# Patient Record
Sex: Female | Born: 1980 | Race: Black or African American | Marital: Married | Smoking: Former smoker
Health system: Southern US, Community
[De-identification: ages and names within clinical notes are randomized; demographics above are authoritative.]

## PROBLEM LIST (undated history)

## (undated) DIAGNOSIS — J45909 Unspecified asthma, uncomplicated: Secondary | ICD-10-CM

## (undated) DIAGNOSIS — I10 Essential (primary) hypertension: Secondary | ICD-10-CM

## (undated) DIAGNOSIS — E119 Type 2 diabetes mellitus without complications: Secondary | ICD-10-CM

## (undated) DIAGNOSIS — R87629 Unspecified abnormal cytological findings in specimens from vagina: Secondary | ICD-10-CM

## (undated) DIAGNOSIS — O24419 Gestational diabetes mellitus in pregnancy, unspecified control: Secondary | ICD-10-CM

## (undated) HISTORY — PX: BREAST CYST EXCISION: SHX579

## (undated) HISTORY — DX: Essential (primary) hypertension: I10

## (undated) HISTORY — PX: WISDOM TOOTH EXTRACTION: SHX21

## (undated) HISTORY — DX: Type 2 diabetes mellitus without complications: E11.9

## (undated) HISTORY — DX: Unspecified abnormal cytological findings in specimens from vagina: R87.629

## (undated) HISTORY — DX: Unspecified asthma, uncomplicated: J45.909

---
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
## 2004-10-22 DIAGNOSIS — O24419 Gestational diabetes mellitus in pregnancy, unspecified control: Secondary | ICD-10-CM

## 2006-03-13 DIAGNOSIS — O1490 Unspecified pre-eclampsia, unspecified trimester: Secondary | ICD-10-CM

## 2009-03-13 DIAGNOSIS — IMO0001 Reserved for inherently not codable concepts without codable children: Secondary | ICD-10-CM

## 2012-06-18 DIAGNOSIS — O24419 Gestational diabetes mellitus in pregnancy, unspecified control: Secondary | ICD-10-CM

## 2015-03-05 NOTE — L&D Delivery Note (Signed)
Delivery Note Called to room for precipitous delivery.  Baby was born as I walked in the door.  At 6:27 AM a viable and healthy female was delivered via Vaginal, Spontaneous Delivery (Presentation: ; Occiput Anterior).  APGAR:9/9 , ; weight  .   Placenta status: Intact, Spontaneous.  Cord: 3 vessels with the following complications: None.    Anesthesia: None  Episiotomy: None Lacerations: None Suture Repair: none required Est. Blood Loss (mL): 75  Mom to postpartum.  Baby to Couplet care / Skin to Skin.  [facility],MARIE 06/18/2015, 6:41 AM

## 2015-03-30 LAB — OB RESULTS CONSOLE GC/CHLAMYDIA
Chlamydia: NEGATIVE
Gonorrhea: NEGATIVE

## 2015-03-30 LAB — OB RESULTS CONSOLE ABO/RH: "RH Type ": POSITIVE

## 2015-03-30 LAB — OB RESULTS CONSOLE RUBELLA ANTIBODY, IGM: Rubella: IMMUNE

## 2015-03-30 LAB — CYTOLOGY - PAP: Pap: NEGATIVE

## 2015-03-30 LAB — OB RESULTS CONSOLE HEPATITIS B SURFACE ANTIGEN: Hepatitis B Surface Ag: NEGATIVE

## 2015-03-30 LAB — OB RESULTS CONSOLE PLATELET COUNT: Platelets: 132 10*3/uL

## 2015-03-30 LAB — OB RESULTS CONSOLE VARICELLA ZOSTER ANTIBODY, IGG: VARICELLA IGG: IMMUNE

## 2015-03-30 LAB — OB RESULTS CONSOLE ANTIBODY SCREEN: ANTIBODY SCREEN: NEGATIVE

## 2015-03-30 LAB — OB RESULTS CONSOLE RPR: RPR: NONREACTIVE

## 2015-03-30 LAB — OB RESULTS CONSOLE HGB/HCT, BLOOD
HCT: 34 %
Hemoglobin: 10.9 g/dL

## 2015-03-30 LAB — OB RESULTS CONSOLE HIV ANTIBODY (ROUTINE TESTING): HIV: NONREACTIVE

## 2015-04-03 ENCOUNTER — Encounter: Payer: Self-pay | Admitting: *Deleted

## 2015-04-03 DIAGNOSIS — O09529 Supervision of elderly multigravida, unspecified trimester: Secondary | ICD-10-CM

## 2015-04-03 DIAGNOSIS — O0990 Supervision of high risk pregnancy, unspecified, unspecified trimester: Secondary | ICD-10-CM

## 2015-04-03 DIAGNOSIS — O24419 Gestational diabetes mellitus in pregnancy, unspecified control: Secondary | ICD-10-CM

## 2015-04-03 DIAGNOSIS — Z8759 Personal history of other complications of pregnancy, childbirth and the puerperium: Secondary | ICD-10-CM | POA: Insufficient documentation

## 2015-04-03 DIAGNOSIS — O0930 Supervision of pregnancy with insufficient antenatal care, unspecified trimester: Secondary | ICD-10-CM

## 2015-04-07 ENCOUNTER — Encounter: Payer: Self-pay | Admitting: *Deleted

## 2015-04-07 DIAGNOSIS — O0930 Supervision of pregnancy with insufficient antenatal care, unspecified trimester: Secondary | ICD-10-CM | POA: Insufficient documentation

## 2015-04-10 ENCOUNTER — Encounter: Payer: Self-pay | Admitting: Family Medicine

## 2015-04-10 ENCOUNTER — Ambulatory Visit (INDEPENDENT_AMBULATORY_CARE_PROVIDER_SITE_OTHER): Payer: Medicaid Other | Admitting: Family Medicine

## 2015-04-10 ENCOUNTER — Encounter: Payer: Medicaid Other | Attending: Family Medicine | Admitting: *Deleted

## 2015-04-10 VITALS — BP 109/63 | HR 91 | Temp 98.0°F | Ht 66.0 in | Wt 226.1 lb

## 2015-04-10 DIAGNOSIS — J4520 Mild intermittent asthma, uncomplicated: Secondary | ICD-10-CM | POA: Diagnosis not present

## 2015-04-10 DIAGNOSIS — O09523 Supervision of elderly multigravida, third trimester: Secondary | ICD-10-CM

## 2015-04-10 DIAGNOSIS — Z641 Problems related to multiparity: Secondary | ICD-10-CM

## 2015-04-10 DIAGNOSIS — O09293 Supervision of pregnancy with other poor reproductive or obstetric history, third trimester: Secondary | ICD-10-CM

## 2015-04-10 DIAGNOSIS — O24410 Gestational diabetes mellitus in pregnancy, diet controlled: Secondary | ICD-10-CM

## 2015-04-10 DIAGNOSIS — O09529 Supervision of elderly multigravida, unspecified trimester: Secondary | ICD-10-CM

## 2015-04-10 DIAGNOSIS — T7840XA Allergy, unspecified, initial encounter: Secondary | ICD-10-CM | POA: Insufficient documentation

## 2015-04-10 DIAGNOSIS — J45909 Unspecified asthma, uncomplicated: Secondary | ICD-10-CM | POA: Insufficient documentation

## 2015-04-10 DIAGNOSIS — R8271 Bacteriuria: Secondary | ICD-10-CM | POA: Diagnosis not present

## 2015-04-10 DIAGNOSIS — Z029 Encounter for administrative examinations, unspecified: Secondary | ICD-10-CM | POA: Diagnosis present

## 2015-04-10 DIAGNOSIS — O09299 Supervision of pregnancy with other poor reproductive or obstetric history, unspecified trimester: Secondary | ICD-10-CM | POA: Insufficient documentation

## 2015-04-10 DIAGNOSIS — O0993 Supervision of high risk pregnancy, unspecified, third trimester: Secondary | ICD-10-CM

## 2015-04-10 LAB — POCT URINALYSIS DIP (DEVICE)
Bilirubin Urine: NEGATIVE
Glucose, UA: NEGATIVE mg/dL
Hgb urine dipstick: NEGATIVE
Ketones, ur: NEGATIVE mg/dL
Leukocytes, UA: NEGATIVE
Nitrite: NEGATIVE
Protein, ur: NEGATIVE mg/dL
Specific Gravity, Urine: 1.015 (ref 1.005–1.030)
Urobilinogen, UA: 0.2 mg/dL (ref 0.0–1.0)
pH: 7 (ref 5.0–8.0)

## 2015-04-10 MED ORDER — ACCU-CHEK NANO SMARTVIEW W/DEVICE KIT: 1.0000 [IU] | PACK | Status: DC

## 2015-04-10 MED ORDER — GLUCOSE BLOOD VI STRP: ORAL_STRIP | Status: DC

## 2015-04-10 MED ORDER — ACCU-CHEK FASTCLIX LANCETS MISC: 1.0000 [IU] | Freq: Four times a day (QID) | Status: DC

## 2015-04-10 NOTE — Patient Instructions (Signed)
Gestational Diabetes Mellitus Gestational diabetes mellitus, often simply referred to as gestational diabetes, is a type of diabetes that some women develop during pregnancy. In gestational diabetes, the pancreas does not make enough insulin (a hormone), the cells are less responsive to the insulin that is made (insulin resistance), or both.Normally, insulin moves sugars from food into the tissue cells. The tissue cells use the sugars for energy. The lack of insulin or the lack of normal response to insulin causes excess sugars to build up in the blood instead of going into the tissue cells. As a result, high blood sugar (hyperglycemia) develops. The effect of high sugar (glucose) levels can cause many problems.  RISK FACTORS You have an increased chance of developing gestational diabetes if you have a family history of diabetes and also have one or more of the following risk factors:  A body mass index over 30 (obesity).  A previous pregnancy with gestational diabetes.  An older age at the time of pregnancy. If blood glucose levels are kept in the normal range during pregnancy, women can have a healthy pregnancy. If your blood glucose levels are not well controlled, there may be risks to you, your unborn baby (fetus), your labor and delivery, or your newborn baby.  SYMPTOMS  If symptoms are experienced, they are much like symptoms you would normally expect during pregnancy. The symptoms of gestational diabetes include:   Increased thirst (polydipsia).  Increased urination (polyuria).  Increased urination during the night (nocturia).  Weight loss. This weight loss may be rapid.  Frequent, recurring infections.  Tiredness (fatigue).  Weakness.  Vision changes, such as blurred vision.  Fruity smell to your breath.  Abdominal pain. DIAGNOSIS Diabetes is diagnosed when blood glucose levels are increased. Your blood glucose level may be checked by one or more of the following blood  tests:  A fasting blood glucose test. You will not be allowed to eat for at least 8 hours before a blood sample is taken.  A random blood glucose test. Your blood glucose is checked at any time of the day regardless of when you ate.  An oral glucose tolerance test (OGTT). Your blood glucose is measured after you have not eaten (fasted) for 1-3 hours and then after you drink a glucose-containing beverage. Since the hormones that cause insulin resistance are highest at about 24-28 weeks of a pregnancy, an OGTT is usually performed during that time. If you have risk factors, you may be screened for undiagnosed type 2 diabetes at your first prenatal visit. TREATMENT  Gestational diabetes should be managed first with diet and exercise. Medicines may be added only if they are needed.  You will need to take diabetes medicine or insulin daily to keep blood glucose levels in the desired range.  You will need to match insulin dosing with exercise and healthy food choices. If you have gestational diabetes, your treatment goal is to maintain the following blood glucose levels:  Before meals (preprandial): at or below 95 mg/dL.  After meals (postprandial):  One hour after a meal: at or below 140 mg/dL.  Two hours after a meal: at or below 120 mg/dL. If you have pre-existing type 1 or type 2 diabetes, your treatment goal is to maintain the following blood glucose levels:  Before meals, at bedtime, and overnight: 60-99 mg/dL.  After meals: peak of 100-129 mg/dL. HOME CARE INSTRUCTIONS   Have your hemoglobin A1c level checked twice a year.  Perform daily blood glucose monitoring as directed by   your health care provider. It is common to perform frequent blood glucose monitoring.  Monitor urine ketones when you are ill and as directed by your health care provider.  Take your diabetes medicine and insulin as directed by your health care provider to maintain your blood glucose level in the desired  range.  Never run out of diabetes medicine or insulin. It is needed every day.  Adjust insulin based on your intake of carbohydrates. Carbohydrates can raise blood glucose levels but need to be included in your diet. Carbohydrates provide vitamins, minerals, and fiber which are an essential part of a healthy diet. Carbohydrates are found in fruits, vegetables, whole grains, dairy products, legumes, and foods containing added sugars.  Eat healthy foods. Alternate 3 meals with 3 snacks.  Maintain a healthy weight gain. The usual total expected weight gain varies according to your prepregnancy body mass index (BMI).  Carry a medical alert card or wear your medical alert jewelry.  Carry a 15-gram carbohydrate snack with you at all times to treat low blood glucose (hypoglycemia). Some examples of 15-gram carbohydrate snacks include:  Glucose tablets, 3 or 4.  Glucose gel, 15-gram tube.  Raisins, 2 tablespoons (24 g).  Jelly beans, 6.  Animal crackers, 8.  Fruit juice, regular soda, or low-fat milk, 4 ounces (120 mL).  Gummy treats, 9.  Recognize hypoglycemia. Hypoglycemia during pregnancy occurs with blood glucose levels of 60 mg/dL and below. The risk for hypoglycemia increases when fasting or skipping meals, during or after intense exercise, and during sleep. Hypoglycemia symptoms can include:  Tremors or shakes.  Decreased ability to concentrate.  Sweating.  Increased heart rate.  Headache.  Dry mouth.  Hunger.  Irritability.  Anxiety.  Restless sleep.  Altered speech or coordination.  Confusion.  Treat hypoglycemia promptly. If you are alert and able to safely swallow, follow the 15:15 rule:  Take 15-20 grams of rapid-acting glucose or carbohydrate. Rapid-acting options include glucose gel, glucose tablets, or 4 ounces (120 mL) of fruit juice, regular soda, or low-fat milk.  Check your blood glucose level 15 minutes after taking the glucose.  Take 15-20  grams more of glucose if the repeat blood glucose level is still 70 mg/dL or below.  Eat a meal or snack within 1 hour once blood glucose levels return to normal.  Be alert to polyuria (excess urination) and polydipsia (excess thirst) which are early signs of hyperglycemia. An early awareness of hyperglycemia allows for prompt treatment. Treat hyperglycemia as directed by your health care provider.  Engage in at least 30 minutes of physical activity a day or as directed by your health care provider. Ten minutes of physical activity timed 30 minutes after each meal is encouraged to control postprandial blood glucose levels.  Adjust your insulin dosing and food intake as needed if you start a new exercise or sport.  Follow your sick-day plan at any time you are unable to eat or drink as usual.  Avoid tobacco and alcohol use.  Keep all follow-up visits as directed by your health care provider.  Follow the advice of your health care provider regarding your prenatal and post-delivery (postpartum) appointments, meal planning, exercise, medicines, vitamins, blood tests, other medical tests, and physical activities.  Perform daily skin and foot care. Examine your skin and feet daily for cuts, bruises, redness, nail problems, bleeding, blisters, or sores.  Brush your teeth and gums at least twice a day and floss at least once a day. Follow up with your dentist   regularly.  Schedule an eye exam during the first trimester of your pregnancy or as directed by your health care provider.  Share your diabetes management plan with your workplace or school.  Stay up-to-date with immunizations.  Learn to manage stress.  Obtain ongoing diabetes education and support as needed.  Learn about and consider breastfeeding your baby.  You should have your blood sugar level checked 6-12 weeks after delivery. This is done with an oral glucose tolerance test (OGTT). SEEK MEDICAL CARE IF:   You are unable to  eat food or drink fluids for more than 6 hours.  You have nausea and vomiting for more than 6 hours.  You have a blood glucose level of 200 mg/dL and you have ketones in your urine.  There is a change in mental status.  You develop vision problems.  You have a persistent headache.  You have upper abdominal pain or discomfort.  You develop an additional serious illness.  You have diarrhea for more than 6 hours.  You have been sick or have had a fever for a couple of days and are not getting better. SEEK IMMEDIATE MEDICAL CARE IF:   You have difficulty breathing.  You no longer feel the baby moving.  You are bleeding or have discharge from your vagina.  You start having premature contractions or labor. MAKE SURE YOU:  Understand these instructions.  Will watch your condition.  Will get help right away if you are not doing well or get worse.   This information is not intended to replace advice given to you by your health care provider. Make sure you discuss any questions you have with your health care provider.   Document Released: 05/27/2000 Document Revised: 03/11/2014 Document Reviewed: 09/17/2011 Elsevier Interactive Patient Education 2016 [employer]  Breastfeeding Deciding to breastfeed is one of the best choices you can make for you and your baby. A change in hormones during pregnancy causes your breast tissue to grow and increases the number and size of your milk ducts. These hormones also allow proteins, sugars, and fats from your blood supply to make breast milk in your milk-producing glands. Hormones prevent breast milk from being released before your baby is born as well as prompt milk flow after birth. Once breastfeeding has begun, thoughts of your baby, as well as his or her sucking or crying, can stimulate the release of milk from your milk-producing glands.  BENEFITS OF BREASTFEEDING For Your Baby  Your first milk (colostrum) helps your baby's digestive  system function better.  There are antibodies in your milk that help your baby fight off infections.  Your baby has a lower incidence of asthma, allergies, and sudden infant death syndrome.  The nutrients in breast milk are better for your baby than infant formulas and are designed uniquely for your baby's needs.  Breast milk improves your baby's brain development.  Your baby is less likely to develop other conditions, such as childhood obesity, asthma, or type 2 diabetes mellitus. For You  Breastfeeding helps to create a very special bond between you and your baby.  Breastfeeding is convenient. Breast milk is always available at the correct temperature and costs nothing.  Breastfeeding helps to burn calories and helps you lose the weight gained during pregnancy.  Breastfeeding makes your uterus contract to its prepregnancy size faster and slows bleeding (lochia) after you give birth.   Breastfeeding helps to lower your risk of developing type 2 diabetes mellitus, osteoporosis, and breast or ovarian cancer   later in life. SIGNS THAT YOUR BABY IS HUNGRY Early Signs of Hunger  Increased alertness or activity.  Stretching.  Movement of the head from side to side.  Movement of the head and opening of the mouth when the corner of the mouth or cheek is stroked (rooting).  Increased sucking sounds, smacking lips, cooing, sighing, or squeaking.  Hand-to-mouth movements.  Increased sucking of fingers or hands. Late Signs of Hunger  Fussing.  Intermittent crying. Extreme Signs of Hunger Signs of extreme hunger will require calming and consoling before your baby will be able to breastfeed successfully. Do not wait for the following signs of extreme hunger to occur before you initiate breastfeeding:  Restlessness.  A loud, strong cry.  Screaming. BREASTFEEDING BASICS Breastfeeding Initiation  Find a comfortable place to sit or lie down, with your neck and back well  supported.  Place a pillow or rolled up blanket under your baby to bring him or her to the level of your breast (if you are seated). Nursing pillows are specially designed to help support your arms and your baby while you breastfeed.  Make sure that your baby's abdomen is facing your abdomen.  Gently massage your breast. With your fingertips, massage from your chest wall toward your nipple in a circular motion. This encourages milk flow. You may need to continue this action during the feeding if your milk flows slowly.  Support your breast with 4 fingers underneath and your thumb above your nipple. Make sure your fingers are well away from your nipple and your baby's mouth.  Stroke your baby's lips gently with your finger or nipple.  When your baby's mouth is open wide enough, quickly bring your baby to your breast, placing your entire nipple and as much of the colored area around your nipple (areola) as possible into your baby's mouth.  More areola should be visible above your baby's upper lip than below the lower lip.  Your baby's tongue should be between his or her lower gum and your breast.  Ensure that your baby's mouth is correctly positioned around your nipple (latched). Your baby's lips should create a seal on your breast and be turned out (everted).  It is common for your baby to suck about 2-3 minutes in order to start the flow of breast milk. Latching Teaching your baby how to latch on to your breast properly is very important. An improper latch can cause nipple pain and decreased milk supply for you and poor weight gain in your baby. Also, if your baby is not latched onto your nipple properly, he or she may swallow some air during feeding. This can make your baby fussy. Burping your baby when you switch breasts during the feeding can help to get rid of the air. However, teaching your baby to latch on properly is still the best way to prevent fussiness from swallowing air while  breastfeeding. Signs that your baby has successfully latched on to your nipple:  Silent tugging or silent sucking, without causing you pain.  Swallowing heard between every 3-4 sucks.  Muscle movement above and in front of his or her ears while sucking. Signs that your baby has not successfully latched on to nipple:  Sucking sounds or smacking sounds from your baby while breastfeeding.  Nipple pain. If you think your baby has not latched on correctly, slip your finger into the corner of your baby's mouth to break the suction and place it between your baby's gums. Attempt breastfeeding initiation again. Signs   of Successful Breastfeeding Signs from your baby:  A gradual decrease in the number of sucks or complete cessation of sucking.  Falling asleep.  Relaxation of his or her body.  Retention of a small amount of milk in his or her mouth.  Letting go of your breast by himself or herself. Signs from you:  Breasts that have increased in firmness, weight, and size 1-3 hours after feeding.  Breasts that are softer immediately after breastfeeding.  Increased milk volume, as well as a change in milk consistency and color by the fifth day of breastfeeding.  Nipples that are not sore, cracked, or bleeding. Signs That Your Baby is Getting Enough Milk  Wetting at least 3 diapers in a 24-hour period. The urine should be clear and pale yellow by age 5 days.  At least 3 stools in a 24-hour period by age 5 days. The stool should be soft and yellow.  At least 3 stools in a 24-hour period by age 7 days. The stool should be seedy and yellow.  No loss of weight greater than 10% of birth weight during the first 3 days of age.  Average weight gain of 4-7 ounces (113-198 g) per week after age 4 days.  Consistent daily weight gain by age 5 days, without weight loss after the age of 2 weeks. After a feeding, your baby may spit up a small amount. This is common. BREASTFEEDING FREQUENCY AND  DURATION Frequent feeding will help you make more milk and can prevent sore nipples and breast engorgement. Breastfeed when you feel the need to reduce the fullness of your breasts or when your baby shows signs of hunger. This is called "breastfeeding on demand." Avoid introducing a pacifier to your baby while you are working to establish breastfeeding (the first 4-6 weeks after your baby is born). After this time you may choose to use a pacifier. Research has shown that pacifier use during the first year of a baby's life decreases the risk of sudden infant death syndrome (SIDS). Allow your baby to feed on each breast as long as he or she wants. Breastfeed until your baby is finished feeding. When your baby unlatches or falls asleep while feeding from the first breast, offer the second breast. Because newborns are often sleepy in the first few weeks of life, you may need to awaken your baby to get him or her to feed. Breastfeeding times will vary from baby to baby. However, the following rules can serve as a guide to help you ensure that your baby is properly fed:  Newborns (babies 4 weeks of age or younger) may breastfeed every 1-3 hours.  Newborns should not go longer than 3 hours during the day or 5 hours during the night without breastfeeding.  You should breastfeed your baby a minimum of 8 times in a 24-hour period until you begin to introduce solid foods to your baby at around 6 months of age. BREAST MILK PUMPING Pumping and storing breast milk allows you to ensure that your baby is exclusively fed your breast milk, even at times when you are unable to breastfeed. This is especially important if you are going back to work while you are still breastfeeding or when you are not able to be present during feedings. Your lactation consultant can give you guidelines on how long it is safe to store breast milk. A breast pump is a machine that allows you to pump milk from your breast into a sterile bottle.  The pumped   breast milk can then be stored in a refrigerator or freezer. Some breast pumps are operated by hand, while others use electricity. Ask your lactation consultant which type will work best for you. Breast pumps can be purchased, but some hospitals and breastfeeding support groups lease breast pumps on a monthly basis. A lactation consultant can teach you how to hand express breast milk, if you prefer not to use a pump. CARING FOR YOUR BREASTS WHILE YOU BREASTFEED Nipples can become dry, cracked, and sore while breastfeeding. The following recommendations can help keep your breasts moisturized and healthy:  Avoid using soap on your nipples.  Wear a supportive bra. Although not required, special nursing bras and tank tops are designed to allow access to your breasts for breastfeeding without taking off your entire bra or top. Avoid wearing underwire-style bras or extremely tight bras.  Air dry your nipples for 3-4minutes after each feeding.  Use only cotton bra pads to absorb leaked breast milk. Leaking of breast milk between feedings is normal.  Use lanolin on your nipples after breastfeeding. Lanolin helps to maintain your skin's normal moisture barrier. If you use pure lanolin, you do not need to wash it off before feeding your baby again. Pure lanolin is not toxic to your baby. You may also hand express a few drops of breast milk and gently massage that milk into your nipples and allow the milk to air dry. In the first few weeks after giving birth, some women experience extremely full breasts (engorgement). Engorgement can make your breasts feel heavy, warm, and tender to the touch. Engorgement peaks within 3-5 days after you give birth. The following recommendations can help ease engorgement:  Completely empty your breasts while breastfeeding or pumping. You may want to start by applying warm, moist heat (in the shower or with warm water-soaked hand towels) just before feeding or pumping.  This increases circulation and helps the milk flow. If your baby does not completely empty your breasts while breastfeeding, pump any extra milk after he or she is finished.  Wear a snug bra (nursing or regular) or tank top for 1-2 days to signal your body to slightly decrease milk production.  Apply ice packs to your breasts, unless this is too uncomfortable for you.  Make sure that your baby is latched on and positioned properly while breastfeeding. If engorgement persists after 48 hours of following these recommendations, contact your health care provider or a lactation consultant. OVERALL HEALTH CARE RECOMMENDATIONS WHILE BREASTFEEDING  Eat healthy foods. Alternate between meals and snacks, eating 3 of each per day. Because what you eat affects your breast milk, some of the foods may make your baby more irritable than usual. Avoid eating these foods if you are sure that they are negatively affecting your baby.  Drink milk, fruit juice, and water to satisfy your thirst (about 10 glasses a day).  Rest often, relax, and continue to take your prenatal vitamins to prevent fatigue, stress, and anemia.  Continue breast self-awareness checks.  Avoid chewing and smoking tobacco. Chemicals from cigarettes that pass into breast milk and exposure to secondhand smoke may harm your baby.  Avoid alcohol and drug use, including marijuana. Some medicines that may be harmful to your baby can pass through breast milk. It is important to ask your health care provider before taking any medicine, including all over-the-counter and prescription medicine as well as vitamin and herbal supplements. It is possible to become pregnant while breastfeeding. If birth control is desired, ask   your health care provider about options that will be safe for your baby. SEEK MEDICAL CARE IF:  You feel like you want to stop breastfeeding or have become frustrated with breastfeeding.  You have painful breasts or  nipples.  Your nipples are cracked or bleeding.  Your breasts are red, tender, or warm.  You have a swollen area on either breast.  You have a fever or chills.  You have nausea or vomiting.  You have drainage other than breast milk from your nipples.  Your breasts do not become full before feedings by the fifth day after you give birth.  You feel sad and depressed.  Your baby is too sleepy to eat well.  Your baby is having trouble sleeping.   Your baby is wetting less than 3 diapers in a 24-hour period.  Your baby has less than 3 stools in a 24-hour period.  Your baby's skin or the white part of his or her eyes becomes yellow.   Your baby is not gaining weight by 5 days of age. SEEK IMMEDIATE MEDICAL CARE IF:  Your baby is overly tired (lethargic) and does not want to wake up and feed.  Your baby develops an unexplained fever.   This information is not intended to replace advice given to you by your health care provider. Make sure you discuss any questions you have with your health care provider.   Document Released: 02/18/2005 Document Revised: 11/09/2014 Document Reviewed: 08/12/2012 Elsevier Interactive Patient Education 2016 [employer]  

## 2015-04-10 NOTE — Progress Notes (Signed)
Pt refuses tdap and flu vaccine. States that she doesn't take any vaccinations.

## 2015-04-10 NOTE — Progress Notes (Signed)
  Patient was seen on 04/10/15 for Gestational Diabetes self-management . Patient has had GDM with multiple pregnancies. The following learning objectives were met by the patient :   States the definition of Gestational Diabetes  States when to check blood glucose levels  Demonstrates proper blood glucose monitoring techniques  States the effect of stress and exercise on blood glucose levels  States the importance of limiting caffeine and abstaining from alcohol and smoking  Plan:  Consider  increasing your activity level by walking daily as tolerated Begin checking BG before breakfast and 2 hours after first bit of breakfast, lunch and dinner after  as directed by MD  Take medication  as directed by MD  Blood glucose monitor given: Accu-chek AvivaConnect Lot # [id number] Exp: 04/03/16 Blood glucose reading: '167mg'$ /dl 1hpp chicken biscuit cheese  Patient instructed to monitor glucose levels: FBS: 60 - <90 2 hour: <120  Patient received the following handouts:  Nutrition Diabetes and Pregnancy  Patient will be seen for follow-up as needed.

## 2015-04-10 NOTE — Progress Notes (Signed)
Nutrition note: 1st visit consult & GDM diet education Pt has h/o obesity & has had GDM with previous pregnancies. Pt was recently diagnosed with GDM with this pregnancy. Pt has gained 26.1# @ [redacted]w[redacted]d, which is > expected. Pt reports eating 2-3 meals & 2-3 snacks/d (including a meal or snack ~midnight). Pt also reports drinking 1 Ensure most days. Pt is taking a PNV. Pt reports N&V occ but no heartburn. Pt reports walking for at least 30 mins 5x/wk. Pt received verbal & written education on GDM diet. Pt completed "Worksheet for Diabetes with Pregnancy." Discussed tips to decrease N&V. Discussed wt gain goals of 11-20# or 0.5#/wk. Pt agrees to follow GDM diet with 3 meals & 1-3 snacks/d with proper CHO/ protein combination. Pt does not have WIC but plans to apply. Pt plans to BF. F/u in 2-4 wks Stephanie Reveal, MS, RD, LDN, [facility]

## 2015-04-10 NOTE — Progress Notes (Signed)
Subjective:  Stephanie Wells is a 35 y.o. G7P5015 at [redacted]w[redacted]d being seen today for transferring prenatal care from [name] for GDM.  She is currently monitored for the following issues for this high-risk pregnancy and has Supervision of high-risk pregnancy; Gestational diabetes; History of pre-eclampsia; AMA (advanced maternal age) multigravida 35+; Late prenatal care; Airway hyperreactivity; Allergic state; Grand multipara; and History of macrosomia in infant in prior pregnancy, currently pregnant on her problem list.  Patient reports no complaints.  Contractions: Not present. Vag. Bleeding: None.  Movement: Present. Denies leaking of fluid.   The following portions of the patient's history were reviewed and updated as appropriate: allergies, current medications, past family history, past medical history, past social history, past surgical history and problem list. Problem list updated.  Objective:   Filed Vitals:   04/10/15 0955  BP: 109/63  Pulse: 91  Temp: 98 F (36.7 C)  Weight: 226 lb 1.6 oz (102.558 kg)    Fetal Status: Fetal Heart Rate (bpm): 156 Fundal Height: 31 cm Movement: Present     General:  Alert, oriented and cooperative. Patient is in no acute distress.  Skin: Skin is warm and dry. No rash noted.   Cardiovascular: Normal heart rate noted  Respiratory: Normal respiratory effort, no problems with respiration noted  Abdomen: Soft, gravid, appropriate for gestational age. Pain/Pressure: Absent     Pelvic: Vag. Bleeding: None     Cervical exam deferred        Extremities: Normal range of motion.  Edema: None  Mental Status: Normal mood and affect. Normal behavior. Normal judgment and thought content.   Urinalysis: Urine Protein: Negative Urine Glucose: Negative  Assessment and Plan:  Pregnancy: [id number] at [redacted]w[redacted]d  1. Supervision of high-risk pregnancy, third trimester Continue prenatal care.  2. Grand multipara At risk of PPH  3. AMA (advanced maternal age) multigravida  35+, unspecified trimester Declined genetics  4. Diet controlled gestational diabetes mellitus in third trimester Nutrition and Diabetes Managment - ACCU-CHEK FASTCLIX LANCETS MISC; 1 Units by Percutaneous route 4 (four) times daily.  Dispense: 100 each; Refill: 12 - glucose blood (ACCU-CHEK SMARTVIEW) test strip; New DX GDM O24.419 for testing 4 times daily. Please dispense Accu-Chek Aviva Plus test strips  Dispense: 100 each; Refill: 12  5. History of macrosomia in infant in prior pregnancy, currently pregnant, third trimester No issues with delivery  Preterm labor symptoms and general obstetric precautions including but not limited to vaginal bleeding, contractions, leaking of fluid and fetal movement were reviewed in detail with the patient. Please refer to After Visit Summary for other counseling recommendations.  Return in 2 weeks (on 04/24/2015).   [name], MD

## 2015-04-17 ENCOUNTER — Telehealth: Payer: Self-pay | Admitting: *Deleted

## 2015-04-17 NOTE — Telephone Encounter (Signed)
Patient stated returning a call about her blood sugars. Has appointment next Monday. Call her back.

## 2015-04-22 ENCOUNTER — Encounter: Payer: Self-pay | Admitting: *Deleted

## 2015-04-24 ENCOUNTER — Encounter: Payer: Medicaid Other | Admitting: Family Medicine

## 2015-04-24 ENCOUNTER — Ambulatory Visit (INDEPENDENT_AMBULATORY_CARE_PROVIDER_SITE_OTHER): Payer: Medicaid Other | Admitting: Family Medicine

## 2015-04-24 ENCOUNTER — Encounter: Payer: Self-pay | Admitting: Family Medicine

## 2015-04-24 VITALS — BP 113/68 | HR 88 | Temp 98.4°F | Wt 229.9 lb

## 2015-04-24 DIAGNOSIS — O0993 Supervision of high risk pregnancy, unspecified, third trimester: Secondary | ICD-10-CM

## 2015-04-24 DIAGNOSIS — O24410 Gestational diabetes mellitus in pregnancy, diet controlled: Secondary | ICD-10-CM | POA: Diagnosis not present

## 2015-04-24 LAB — POCT URINALYSIS DIP (DEVICE)
Bilirubin Urine: NEGATIVE
Glucose, UA: NEGATIVE mg/dL
KETONES UR: NEGATIVE mg/dL
Leukocytes, UA: NEGATIVE
Nitrite: NEGATIVE
PH: 6.5 (ref 5.0–8.0)
PROTEIN: NEGATIVE mg/dL
SPECIFIC GRAVITY, URINE: 1.01 (ref 1.005–1.030)
Urobilinogen, UA: 0.2 mg/dL (ref 0.0–1.0)

## 2015-04-24 NOTE — Progress Notes (Signed)
Breastfeeding tip of the week reviewed. 

## 2015-04-24 NOTE — Telephone Encounter (Signed)
Pt showed for appt on 04/24/15 concerns addressed.

## 2015-04-24 NOTE — Patient Instructions (Signed)
Gestational Diabetes Mellitus Gestational diabetes mellitus, often simply referred to as gestational diabetes, is a type of diabetes that some women develop during pregnancy. In gestational diabetes, the pancreas does not make enough insulin (a hormone), the cells are less responsive to the insulin that is made (insulin resistance), or both.Normally, insulin moves sugars from food into the tissue cells. The tissue cells use the sugars for energy. The lack of insulin or the lack of normal response to insulin causes excess sugars to build up in the blood instead of going into the tissue cells. As a result, high blood sugar (hyperglycemia) develops. The effect of high sugar (glucose) levels can cause many problems.  RISK FACTORS You have an increased chance of developing gestational diabetes if you have a family history of diabetes and also have one or more of the following risk factors:  A body mass index over 30 (obesity).  A previous pregnancy with gestational diabetes.  An older age at the time of pregnancy. If blood glucose levels are kept in the normal range during pregnancy, women can have a healthy pregnancy. If your blood glucose levels are not well controlled, there may be risks to you, your unborn baby (fetus), your labor and delivery, or your newborn baby.  SYMPTOMS  If symptoms are experienced, they are much like symptoms you would normally expect during pregnancy. The symptoms of gestational diabetes include:   Increased thirst (polydipsia).  Increased urination (polyuria).  Increased urination during the night (nocturia).  Weight loss. This weight loss may be rapid.  Frequent, recurring infections.  Tiredness (fatigue).  Weakness.  Vision changes, such as blurred vision.  Fruity smell to your breath.  Abdominal pain. DIAGNOSIS Diabetes is diagnosed when blood glucose levels are increased. Your blood glucose level may be checked by one or more of the following blood  tests:  A fasting blood glucose test. You will not be allowed to eat for at least 8 hours before a blood sample is taken.  A random blood glucose test. Your blood glucose is checked at any time of the day regardless of when you ate.  An oral glucose tolerance test (OGTT). Your blood glucose is measured after you have not eaten (fasted) for 1-3 hours and then after you drink a glucose-containing beverage. Since the hormones that cause insulin resistance are highest at about 24-28 weeks of a pregnancy, an OGTT is usually performed during that time. If you have risk factors, you may be screened for undiagnosed type 2 diabetes at your first prenatal visit. TREATMENT  Gestational diabetes should be managed first with diet and exercise. Medicines may be added only if they are needed.  You will need to take diabetes medicine or insulin daily to keep blood glucose levels in the desired range.  You will need to match insulin dosing with exercise and healthy food choices. If you have gestational diabetes, your treatment goal is to maintain the following blood glucose levels:  Before meals (preprandial): at or below 95 mg/dL.  After meals (postprandial):  One hour after a meal: at or below 140 mg/dL.  Two hours after a meal: at or below 120 mg/dL. If you have pre-existing type 1 or type 2 diabetes, your treatment goal is to maintain the following blood glucose levels:  Before meals, at bedtime, and overnight: 60-99 mg/dL.  After meals: peak of 100-129 mg/dL. HOME CARE INSTRUCTIONS   Have your hemoglobin A1c level checked twice a year.  Perform daily blood glucose monitoring as directed by   your health care provider. It is common to perform frequent blood glucose monitoring.  Monitor urine ketones when you are ill and as directed by your health care provider.  Take your diabetes medicine and insulin as directed by your health care provider to maintain your blood glucose level in the desired  range.  Never run out of diabetes medicine or insulin. It is needed every day.  Adjust insulin based on your intake of carbohydrates. Carbohydrates can raise blood glucose levels but need to be included in your diet. Carbohydrates provide vitamins, minerals, and fiber which are an essential part of a healthy diet. Carbohydrates are found in fruits, vegetables, whole grains, dairy products, legumes, and foods containing added sugars.  Eat healthy foods. Alternate 3 meals with 3 snacks.  Maintain a healthy weight gain. The usual total expected weight gain varies according to your prepregnancy body mass index (BMI).  Carry a medical alert card or wear your medical alert jewelry.  Carry a 15-gram carbohydrate snack with you at all times to treat low blood glucose (hypoglycemia). Some examples of 15-gram carbohydrate snacks include:  Glucose tablets, 3 or 4.  Glucose gel, 15-gram tube.  Raisins, 2 tablespoons (24 g).  Jelly beans, 6.  Animal crackers, 8.  Fruit juice, regular soda, or low-fat milk, 4 ounces (120 mL).  Gummy treats, 9.  Recognize hypoglycemia. Hypoglycemia during pregnancy occurs with blood glucose levels of 60 mg/dL and below. The risk for hypoglycemia increases when fasting or skipping meals, during or after intense exercise, and during sleep. Hypoglycemia symptoms can include:  Tremors or shakes.  Decreased ability to concentrate.  Sweating.  Increased heart rate.  Headache.  Dry mouth.  Hunger.  Irritability.  Anxiety.  Restless sleep.  Altered speech or coordination.  Confusion.  Treat hypoglycemia promptly. If you are alert and able to safely swallow, follow the 15:15 rule:  Take 15-20 grams of rapid-acting glucose or carbohydrate. Rapid-acting options include glucose gel, glucose tablets, or 4 ounces (120 mL) of fruit juice, regular soda, or low-fat milk.  Check your blood glucose level 15 minutes after taking the glucose.  Take 15-20  grams more of glucose if the repeat blood glucose level is still 70 mg/dL or below.  Eat a meal or snack within 1 hour once blood glucose levels return to normal.  Be alert to polyuria (excess urination) and polydipsia (excess thirst) which are early signs of hyperglycemia. An early awareness of hyperglycemia allows for prompt treatment. Treat hyperglycemia as directed by your health care provider.  Engage in at least 30 minutes of physical activity a day or as directed by your health care provider. Ten minutes of physical activity timed 30 minutes after each meal is encouraged to control postprandial blood glucose levels.  Adjust your insulin dosing and food intake as needed if you start a new exercise or sport.  Follow your sick-day plan at any time you are unable to eat or drink as usual.  Avoid tobacco and alcohol use.  Keep all follow-up visits as directed by your health care provider.  Follow the advice of your health care provider regarding your prenatal and post-delivery (postpartum) appointments, meal planning, exercise, medicines, vitamins, blood tests, other medical tests, and physical activities.  Perform daily skin and foot care. Examine your skin and feet daily for cuts, bruises, redness, nail problems, bleeding, blisters, or sores.  Brush your teeth and gums at least twice a day and floss at least once a day. Follow up with your dentist   regularly.  Schedule an eye exam during the first trimester of your pregnancy or as directed by your health care provider.  Share your diabetes management plan with your workplace or school.  Stay up-to-date with immunizations.  Learn to manage stress.  Obtain ongoing diabetes education and support as needed.  Learn about and consider breastfeeding your baby.  You should have your blood sugar level checked 6-12 weeks after delivery. This is done with an oral glucose tolerance test (OGTT). SEEK MEDICAL CARE IF:   You are unable to  eat food or drink fluids for more than 6 hours.  You have nausea and vomiting for more than 6 hours.  You have a blood glucose level of 200 mg/dL and you have ketones in your urine.  There is a change in mental status.  You develop vision problems.  You have a persistent headache.  You have upper abdominal pain or discomfort.  You develop an additional serious illness.  You have diarrhea for more than 6 hours.  You have been sick or have had a fever for a couple of days and are not getting better. SEEK IMMEDIATE MEDICAL CARE IF:   You have difficulty breathing.  You no longer feel the baby moving.  You are bleeding or have discharge from your vagina.  You start having premature contractions or labor. MAKE SURE YOU:  Understand these instructions.  Will watch your condition.  Will get help right away if you are not doing well or get worse.   This information is not intended to replace advice given to you by your health care provider. Make sure you discuss any questions you have with your health care provider.   Document Released: 05/27/2000 Document Revised: 03/11/2014 Document Reviewed: 09/17/2011 Elsevier Interactive Patient Education 2016 [employer]  Breastfeeding Deciding to breastfeed is one of the best choices you can make for you and your baby. A change in hormones during pregnancy causes your breast tissue to grow and increases the number and size of your milk ducts. These hormones also allow proteins, sugars, and fats from your blood supply to make breast milk in your milk-producing glands. Hormones prevent breast milk from being released before your baby is born as well as prompt milk flow after birth. Once breastfeeding has begun, thoughts of your baby, as well as his or her sucking or crying, can stimulate the release of milk from your milk-producing glands.  BENEFITS OF BREASTFEEDING For Your Baby  Your first milk (colostrum) helps your baby's digestive  system function better.  There are antibodies in your milk that help your baby fight off infections.  Your baby has a lower incidence of asthma, allergies, and sudden infant death syndrome.  The nutrients in breast milk are better for your baby than infant formulas and are designed uniquely for your baby's needs.  Breast milk improves your baby's brain development.  Your baby is less likely to develop other conditions, such as childhood obesity, asthma, or type 2 diabetes mellitus. For You  Breastfeeding helps to create a very special bond between you and your baby.  Breastfeeding is convenient. Breast milk is always available at the correct temperature and costs nothing.  Breastfeeding helps to burn calories and helps you lose the weight gained during pregnancy.  Breastfeeding makes your uterus contract to its prepregnancy size faster and slows bleeding (lochia) after you give birth.   Breastfeeding helps to lower your risk of developing type 2 diabetes mellitus, osteoporosis, and breast or ovarian cancer   later in life. SIGNS THAT YOUR BABY IS HUNGRY Early Signs of Hunger  Increased alertness or activity.  Stretching.  Movement of the head from side to side.  Movement of the head and opening of the mouth when the corner of the mouth or cheek is stroked (rooting).  Increased sucking sounds, smacking lips, cooing, sighing, or squeaking.  Hand-to-mouth movements.  Increased sucking of fingers or hands. Late Signs of Hunger  Fussing.  Intermittent crying. Extreme Signs of Hunger Signs of extreme hunger will require calming and consoling before your baby will be able to breastfeed successfully. Do not wait for the following signs of extreme hunger to occur before you initiate breastfeeding:  Restlessness.  A loud, strong cry.  Screaming. BREASTFEEDING BASICS Breastfeeding Initiation  Find a comfortable place to sit or lie down, with your neck and back well  supported.  Place a pillow or rolled up blanket under your baby to bring him or her to the level of your breast (if you are seated). Nursing pillows are specially designed to help support your arms and your baby while you breastfeed.  Make sure that your baby's abdomen is facing your abdomen.  Gently massage your breast. With your fingertips, massage from your chest wall toward your nipple in a circular motion. This encourages milk flow. You may need to continue this action during the feeding if your milk flows slowly.  Support your breast with 4 fingers underneath and your thumb above your nipple. Make sure your fingers are well away from your nipple and your baby's mouth.  Stroke your baby's lips gently with your finger or nipple.  When your baby's mouth is open wide enough, quickly bring your baby to your breast, placing your entire nipple and as much of the colored area around your nipple (areola) as possible into your baby's mouth.  More areola should be visible above your baby's upper lip than below the lower lip.  Your baby's tongue should be between his or her lower gum and your breast.  Ensure that your baby's mouth is correctly positioned around your nipple (latched). Your baby's lips should create a seal on your breast and be turned out (everted).  It is common for your baby to suck about 2-3 minutes in order to start the flow of breast milk. Latching Teaching your baby how to latch on to your breast properly is very important. An improper latch can cause nipple pain and decreased milk supply for you and poor weight gain in your baby. Also, if your baby is not latched onto your nipple properly, he or she may swallow some air during feeding. This can make your baby fussy. Burping your baby when you switch breasts during the feeding can help to get rid of the air. However, teaching your baby to latch on properly is still the best way to prevent fussiness from swallowing air while  breastfeeding. Signs that your baby has successfully latched on to your nipple:  Silent tugging or silent sucking, without causing you pain.  Swallowing heard between every 3-4 sucks.  Muscle movement above and in front of his or her ears while sucking. Signs that your baby has not successfully latched on to nipple:  Sucking sounds or smacking sounds from your baby while breastfeeding.  Nipple pain. If you think your baby has not latched on correctly, slip your finger into the corner of your baby's mouth to break the suction and place it between your baby's gums. Attempt breastfeeding initiation again. Signs   of Successful Breastfeeding Signs from your baby:  A gradual decrease in the number of sucks or complete cessation of sucking.  Falling asleep.  Relaxation of his or her body.  Retention of a small amount of milk in his or her mouth.  Letting go of your breast by himself or herself. Signs from you:  Breasts that have increased in firmness, weight, and size 1-3 hours after feeding.  Breasts that are softer immediately after breastfeeding.  Increased milk volume, as well as a change in milk consistency and color by the fifth day of breastfeeding.  Nipples that are not sore, cracked, or bleeding. Signs That Your Baby is Getting Enough Milk  Wetting at least 3 diapers in a 24-hour period. The urine should be clear and pale yellow by age 5 days.  At least 3 stools in a 24-hour period by age 5 days. The stool should be soft and yellow.  At least 3 stools in a 24-hour period by age 7 days. The stool should be seedy and yellow.  No loss of weight greater than 10% of birth weight during the first 3 days of age.  Average weight gain of 4-7 ounces (113-198 g) per week after age 4 days.  Consistent daily weight gain by age 5 days, without weight loss after the age of 2 weeks. After a feeding, your baby may spit up a small amount. This is common. BREASTFEEDING FREQUENCY AND  DURATION Frequent feeding will help you make more milk and can prevent sore nipples and breast engorgement. Breastfeed when you feel the need to reduce the fullness of your breasts or when your baby shows signs of hunger. This is called "breastfeeding on demand." Avoid introducing a pacifier to your baby while you are working to establish breastfeeding (the first 4-6 weeks after your baby is born). After this time you may choose to use a pacifier. Research has shown that pacifier use during the first year of a baby's life decreases the risk of sudden infant death syndrome (SIDS). Allow your baby to feed on each breast as long as he or she wants. Breastfeed until your baby is finished feeding. When your baby unlatches or falls asleep while feeding from the first breast, offer the second breast. Because newborns are often sleepy in the first few weeks of life, you may need to awaken your baby to get him or her to feed. Breastfeeding times will vary from baby to baby. However, the following rules can serve as a guide to help you ensure that your baby is properly fed:  Newborns (babies 4 weeks of age or younger) may breastfeed every 1-3 hours.  Newborns should not go longer than 3 hours during the day or 5 hours during the night without breastfeeding.  You should breastfeed your baby a minimum of 8 times in a 24-hour period until you begin to introduce solid foods to your baby at around 6 months of age. BREAST MILK PUMPING Pumping and storing breast milk allows you to ensure that your baby is exclusively fed your breast milk, even at times when you are unable to breastfeed. This is especially important if you are going back to work while you are still breastfeeding or when you are not able to be present during feedings. Your lactation consultant can give you guidelines on how long it is safe to store breast milk. A breast pump is a machine that allows you to pump milk from your breast into a sterile bottle.  The pumped   breast milk can then be stored in a refrigerator or freezer. Some breast pumps are operated by hand, while others use electricity. Ask your lactation consultant which type will work best for you. Breast pumps can be purchased, but some hospitals and breastfeeding support groups lease breast pumps on a monthly basis. A lactation consultant can teach you how to hand express breast milk, if you prefer not to use a pump. CARING FOR YOUR BREASTS WHILE YOU BREASTFEED Nipples can become dry, cracked, and sore while breastfeeding. The following recommendations can help keep your breasts moisturized and healthy:  Avoid using soap on your nipples.  Wear a supportive bra. Although not required, special nursing bras and tank tops are designed to allow access to your breasts for breastfeeding without taking off your entire bra or top. Avoid wearing underwire-style bras or extremely tight bras.  Air dry your nipples for 3-4minutes after each feeding.  Use only cotton bra pads to absorb leaked breast milk. Leaking of breast milk between feedings is normal.  Use lanolin on your nipples after breastfeeding. Lanolin helps to maintain your skin's normal moisture barrier. If you use pure lanolin, you do not need to wash it off before feeding your baby again. Pure lanolin is not toxic to your baby. You may also hand express a few drops of breast milk and gently massage that milk into your nipples and allow the milk to air dry. In the first few weeks after giving birth, some women experience extremely full breasts (engorgement). Engorgement can make your breasts feel heavy, warm, and tender to the touch. Engorgement peaks within 3-5 days after you give birth. The following recommendations can help ease engorgement:  Completely empty your breasts while breastfeeding or pumping. You may want to start by applying warm, moist heat (in the shower or with warm water-soaked hand towels) just before feeding or pumping.  This increases circulation and helps the milk flow. If your baby does not completely empty your breasts while breastfeeding, pump any extra milk after he or she is finished.  Wear a snug bra (nursing or regular) or tank top for 1-2 days to signal your body to slightly decrease milk production.  Apply ice packs to your breasts, unless this is too uncomfortable for you.  Make sure that your baby is latched on and positioned properly while breastfeeding. If engorgement persists after 48 hours of following these recommendations, contact your health care provider or a lactation consultant. OVERALL HEALTH CARE RECOMMENDATIONS WHILE BREASTFEEDING  Eat healthy foods. Alternate between meals and snacks, eating 3 of each per day. Because what you eat affects your breast milk, some of the foods may make your baby more irritable than usual. Avoid eating these foods if you are sure that they are negatively affecting your baby.  Drink milk, fruit juice, and water to satisfy your thirst (about 10 glasses a day).  Rest often, relax, and continue to take your prenatal vitamins to prevent fatigue, stress, and anemia.  Continue breast self-awareness checks.  Avoid chewing and smoking tobacco. Chemicals from cigarettes that pass into breast milk and exposure to secondhand smoke may harm your baby.  Avoid alcohol and drug use, including marijuana. Some medicines that may be harmful to your baby can pass through breast milk. It is important to ask your health care provider before taking any medicine, including all over-the-counter and prescription medicine as well as vitamin and herbal supplements. It is possible to become pregnant while breastfeeding. If birth control is desired, ask   your health care provider about options that will be safe for your baby. SEEK MEDICAL CARE IF:  You feel like you want to stop breastfeeding or have become frustrated with breastfeeding.  You have painful breasts or  nipples.  Your nipples are cracked or bleeding.  Your breasts are red, tender, or warm.  You have a swollen area on either breast.  You have a fever or chills.  You have nausea or vomiting.  You have drainage other than breast milk from your nipples.  Your breasts do not become full before feedings by the fifth day after you give birth.  You feel sad and depressed.  Your baby is too sleepy to eat well.  Your baby is having trouble sleeping.   Your baby is wetting less than 3 diapers in a 24-hour period.  Your baby has less than 3 stools in a 24-hour period.  Your baby's skin or the white part of his or her eyes becomes yellow.   Your baby is not gaining weight by 5 days of age. SEEK IMMEDIATE MEDICAL CARE IF:  Your baby is overly tired (lethargic) and does not want to wake up and feed.  Your baby develops an unexplained fever.   This information is not intended to replace advice given to you by your health care provider. Make sure you discuss any questions you have with your health care provider.   Document Released: 02/18/2005 Document Revised: 11/09/2014 Document Reviewed: 08/12/2012 Elsevier Interactive Patient Education 2016 [employer]  

## 2015-04-24 NOTE — Progress Notes (Signed)
Subjective:  Stephanie Wells is a 35 y.o. G7P5015 at [redacted]w[redacted]d being seen today for ongoing prenatal care.  She is currently monitored for the following issues for this high-risk pregnancy and has Supervision of high-risk pregnancy; Gestational diabetes; History of pre-eclampsia; AMA (advanced maternal age) multigravida 35+; Late prenatal care; Airway hyperreactivity; Allergic state; Grand multipara; and History of macrosomia in infant in prior pregnancy, currently pregnant on her problem list.  Patient reports no complaints.  Contractions: Not present. Vag. Bleeding: None.  Movement: Present. Denies leaking of fluid.   The following portions of the patient's history were reviewed and updated as appropriate: allergies, current medications, past family history, past medical history, past social history, past surgical history and problem list. Problem list updated.  Objective:   Filed Vitals:   04/24/15 1027  BP: 113/68  Pulse: 88  Temp: 98.4 F (36.9 C)  Weight: 229 lb 14.4 oz (104.282 kg)    Fetal Status: Fetal Heart Rate (bpm): 145   Movement: Present     General:  Alert, oriented and cooperative. Patient is in no acute distress.  Skin: Skin is warm and dry. No rash noted.   Cardiovascular: Normal heart rate noted  Respiratory: Normal respiratory effort, no problems with respiration noted  Abdomen: Soft, gravid, appropriate for gestational age. Pain/Pressure: Absent     Pelvic: Vag. Bleeding: None     Cervical exam deferred        Extremities: Normal range of motion.  Edema: None  Mental Status: Normal mood and affect. Normal behavior. Normal judgment and thought content.   Urinalysis: Urine Protein: Negative Urine Glucose: Negative FBS 99-115 (not true fastings, eating at 3 am to prevent nausea) 2 hour pp 72-151(8 of 40 are out of range) Assessment and Plan:  Pregnancy: [id number] at [redacted]w[redacted]d  1. Supervision of high-risk pregnancy, third trimester Continue prenatal care.  2. Diet  controlled gestational diabetes mellitus in third trimester Continue diet - [place] MFM OB COMP + 14 WK; Future  Preterm labor symptoms and general obstetric precautions including but not limited to vaginal bleeding, contractions, leaking of fluid and fetal movement were reviewed in detail with the patient. Please refer to After Visit Summary for other counseling recommendations.  Return in 2 weeks (on 05/08/2015).   [name], MD

## 2015-04-24 NOTE — Progress Notes (Signed)
U/S scheduled for 05/02/2015 @ 10:15 AM

## 2015-05-02 ENCOUNTER — Encounter (HOSPITAL_COMMUNITY): Payer: Self-pay

## 2015-05-02 ENCOUNTER — Other Ambulatory Visit: Payer: Self-pay | Admitting: Family Medicine

## 2015-05-02 ENCOUNTER — Ambulatory Visit (HOSPITAL_COMMUNITY)
Admission: RE | Admit: 2015-05-02 | Discharge: 2015-05-02 | Disposition: A | Discharge status: Home / Self Care | Payer: Medicaid Other | Source: Ambulatory Visit | Attending: Family Medicine | Admitting: Family Medicine

## 2015-05-02 VITALS — BP 132/65 | HR 88 | Wt 233.6 lb

## 2015-05-02 DIAGNOSIS — O0993 Supervision of high risk pregnancy, unspecified, third trimester: Secondary | ICD-10-CM

## 2015-05-02 DIAGNOSIS — O09523 Supervision of elderly multigravida, third trimester: Secondary | ICD-10-CM

## 2015-05-02 DIAGNOSIS — Z3A32 32 weeks gestation of pregnancy: Secondary | ICD-10-CM | POA: Diagnosis not present

## 2015-05-02 DIAGNOSIS — O24410 Gestational diabetes mellitus in pregnancy, diet controlled: Secondary | ICD-10-CM

## 2015-05-02 DIAGNOSIS — O09293 Supervision of pregnancy with other poor reproductive or obstetric history, third trimester: Secondary | ICD-10-CM | POA: Insufficient documentation

## 2015-05-02 DIAGNOSIS — Z8759 Personal history of other complications of pregnancy, childbirth and the puerperium: Secondary | ICD-10-CM

## 2015-05-02 DIAGNOSIS — O0933 Supervision of pregnancy with insufficient antenatal care, third trimester: Secondary | ICD-10-CM | POA: Diagnosis not present

## 2015-05-02 DIAGNOSIS — Z641 Problems related to multiparity: Secondary | ICD-10-CM

## 2015-05-02 DIAGNOSIS — O0930 Supervision of pregnancy with insufficient antenatal care, unspecified trimester: Secondary | ICD-10-CM

## 2015-05-02 DIAGNOSIS — O09529 Supervision of elderly multigravida, unspecified trimester: Secondary | ICD-10-CM

## 2015-05-08 ENCOUNTER — Encounter (HOSPITAL_COMMUNITY): Payer: Self-pay

## 2015-05-08 ENCOUNTER — Ambulatory Visit (INDEPENDENT_AMBULATORY_CARE_PROVIDER_SITE_OTHER): Payer: Medicaid Other | Admitting: Obstetrics and Gynecology

## 2015-05-08 ENCOUNTER — Encounter: Payer: Self-pay | Admitting: *Deleted

## 2015-05-08 ENCOUNTER — Other Ambulatory Visit (HOSPITAL_COMMUNITY): Payer: Self-pay | Admitting: Maternal and Fetal Medicine

## 2015-05-08 ENCOUNTER — Ambulatory Visit (HOSPITAL_COMMUNITY)
Admission: RE | Admit: 2015-05-08 | Discharge: 2015-05-08 | Disposition: A | Discharge status: Home / Self Care | Payer: Medicaid Other | Source: Ambulatory Visit | Attending: Family Medicine | Admitting: Family Medicine

## 2015-05-08 ENCOUNTER — Encounter: Payer: Self-pay | Admitting: Obstetrics and Gynecology

## 2015-05-08 VITALS — BP 110/60 | HR 81 | Temp 97.6°F | Wt 237.0 lb

## 2015-05-08 DIAGNOSIS — O09523 Supervision of elderly multigravida, third trimester: Secondary | ICD-10-CM

## 2015-05-08 DIAGNOSIS — O24410 Gestational diabetes mellitus in pregnancy, diet controlled: Secondary | ICD-10-CM

## 2015-05-08 DIAGNOSIS — O09293 Supervision of pregnancy with other poor reproductive or obstetric history, third trimester: Secondary | ICD-10-CM | POA: Diagnosis not present

## 2015-05-08 DIAGNOSIS — Z3A33 33 weeks gestation of pregnancy: Secondary | ICD-10-CM

## 2015-05-08 DIAGNOSIS — O0990 Supervision of high risk pregnancy, unspecified, unspecified trimester: Secondary | ICD-10-CM

## 2015-05-08 DIAGNOSIS — O0933 Supervision of pregnancy with insufficient antenatal care, third trimester: Secondary | ICD-10-CM

## 2015-05-08 LAB — POCT URINALYSIS DIP (DEVICE)
BILIRUBIN URINE: NEGATIVE
GLUCOSE, UA: NEGATIVE mg/dL
Ketones, ur: NEGATIVE mg/dL
NITRITE: NEGATIVE
Protein, ur: NEGATIVE mg/dL
Specific Gravity, Urine: 1.02 (ref 1.005–1.030)
UROBILINOGEN UA: 0.2 mg/dL (ref 0.0–1.0)
pH: 5.5 (ref 5.0–8.0)

## 2015-05-08 LAB — GLUCOSE, CAPILLARY: GLUCOSE-CAPILLARY: 96 mg/dL (ref 65–99)

## 2015-05-08 NOTE — Progress Notes (Signed)
Reviewed tip of week with patient Pt unsure of tdap vaccine, will consider

## 2015-05-08 NOTE — Patient Instructions (Signed)

## 2015-05-08 NOTE — Progress Notes (Signed)
Subjective:  Stephanie DinningJessica Wells is a 35 y.o. G7P5015 at 4863w2d being seen today for ongoing prenatal care.  She is currently monitored for the following issues for this high-risk pregnancy and has Supervision of high-risk pregnancy; Gestational diabetes; History of pre-eclampsia; AMA (advanced maternal age) multigravida 35+; Late prenatal care; Airway hyperreactivity; Allergic state; Grand multipara; and History of macrosomia in infant in prior pregnancy, currently pregnant on her problem list.  Patient reports no complaints.  Contractions: Not present. Vag. Bleeding: None.  Movement: Present. Denies leaking of fluid. Forgot log book and states fastings all <100, today 98. PP lunch <120 and PP dinner 120-130 MFM US done this am (report pending)> per pt polyhydramnios and F/U scan scheduled in 1 week  The following portions of the patient's history were reviewed and updated as appropriate: allergies, current medications, past family history, past medical history, past social history, past surgical history and problem list. Problem list updated.  Objective:   Filed Vitals:   05/08/15 0836  BP: 110/60  Pulse: 81  Temp: 97.6 F (36.4 C)  Weight: 237 lb (107.502 kg)   CBG about 2hr PPB now 98.  Fetal Status: Fetal Heart Rate (bpm): 147   Movement: Present     General:  Alert, oriented and cooperative. Patient is in no acute distress.  Skin: Skin is warm and dry. No rash noted.   Cardiovascular: Normal heart rate noted  Respiratory: Normal respiratory effort, no problems with respiration noted  Abdomen: Soft, gravid, appropriate for gestational age. Pain/Pressure: Present     Pelvic: Vag. Bleeding: None     Cervical exam deferred        Extremities: Normal range of motion.  Edema: None  Mental Status: Normal mood and affect. Normal behavior. Normal judgment and thought content.   Urinalysis: Urine Protein: Negative Urine Glucose: Negative  Assessment and Plan:  Pregnancy: [id number] at  3763w2d  1. Diet controlled gestational diabetes mellitus in third trimester 35 yo, obese, hx LGA. Control questionable. Discussed with Dr. Jolayne Pantheronstant. Will review CBGs in 1 wk and consider oral agent. See DM counselor today re: diet counseling - CBG monitoring, ED  Preterm labor symptoms and general obstetric precautions including but not limited to vaginal bleeding, contractions, leaking of fluid and fetal movement were reviewed in detail with the patient. Please refer to After Visit Summary for other counseling recommendations.  Return in about 1 week (around 05/15/2015).   Danae Orleanseirdre C Joycie Aerts, CNM

## 2015-05-08 NOTE — Progress Notes (Signed)
Nutrition Note: GDM follow-up Pt with GDM during pregnancy. Pt has gained 37 # @ 523w2d which is > expected. Discussed weight gain goals of 11-20# during pregnancy.  Pt reports no concerns following GDM diet during pregnancy. Pt reports blood sugar range of < 100 mg/dL during fasting and [id number] md/dL2 hours post-prandial. Pt does not have WIC and does not plan to apply during pregnancy. Follow up as needed. Carloyn Mannerebekah Sharin Altidor MS, RD, LDN

## 2015-05-12 ENCOUNTER — Other Ambulatory Visit: Payer: Self-pay | Admitting: Obstetrics and Gynecology

## 2015-05-12 DIAGNOSIS — O24410 Gestational diabetes mellitus in pregnancy, diet controlled: Secondary | ICD-10-CM

## 2015-05-12 DIAGNOSIS — O09523 Supervision of elderly multigravida, third trimester: Secondary | ICD-10-CM

## 2015-05-12 DIAGNOSIS — Z3A33 33 weeks gestation of pregnancy: Secondary | ICD-10-CM

## 2015-05-12 DIAGNOSIS — O09293 Supervision of pregnancy with other poor reproductive or obstetric history, third trimester: Secondary | ICD-10-CM

## 2015-05-12 DIAGNOSIS — O0933 Supervision of pregnancy with insufficient antenatal care, third trimester: Secondary | ICD-10-CM

## 2015-05-15 ENCOUNTER — Encounter (HOSPITAL_COMMUNITY): Payer: Self-pay

## 2015-05-15 ENCOUNTER — Encounter: Payer: Self-pay | Admitting: Obstetrics and Gynecology

## 2015-05-15 ENCOUNTER — Other Ambulatory Visit: Payer: Self-pay | Admitting: Obstetrics and Gynecology

## 2015-05-15 ENCOUNTER — Ambulatory Visit (INDEPENDENT_AMBULATORY_CARE_PROVIDER_SITE_OTHER): Payer: Medicaid Other | Admitting: Obstetrics and Gynecology

## 2015-05-15 ENCOUNTER — Other Ambulatory Visit (HOSPITAL_COMMUNITY): Payer: Self-pay | Admitting: Maternal and Fetal Medicine

## 2015-05-15 ENCOUNTER — Ambulatory Visit (HOSPITAL_COMMUNITY)
Admission: RE | Admit: 2015-05-15 | Discharge: 2015-05-15 | Disposition: A | Discharge status: Home / Self Care | Payer: Medicaid Other | Source: Ambulatory Visit | Attending: Family Medicine | Admitting: Family Medicine

## 2015-05-15 VITALS — BP 98/55 | HR 86 | Wt 239.2 lb

## 2015-05-15 DIAGNOSIS — O09523 Supervision of elderly multigravida, third trimester: Secondary | ICD-10-CM

## 2015-05-15 DIAGNOSIS — Z8632 Personal history of gestational diabetes: Secondary | ICD-10-CM

## 2015-05-15 DIAGNOSIS — Z3A34 34 weeks gestation of pregnancy: Secondary | ICD-10-CM | POA: Insufficient documentation

## 2015-05-15 DIAGNOSIS — Z641 Problems related to multiparity: Secondary | ICD-10-CM | POA: Diagnosis not present

## 2015-05-15 DIAGNOSIS — O0933 Supervision of pregnancy with insufficient antenatal care, third trimester: Secondary | ICD-10-CM

## 2015-05-15 DIAGNOSIS — O24410 Gestational diabetes mellitus in pregnancy, diet controlled: Secondary | ICD-10-CM | POA: Diagnosis not present

## 2015-05-15 DIAGNOSIS — Z8759 Personal history of other complications of pregnancy, childbirth and the puerperium: Secondary | ICD-10-CM

## 2015-05-15 DIAGNOSIS — O09293 Supervision of pregnancy with other poor reproductive or obstetric history, third trimester: Secondary | ICD-10-CM | POA: Diagnosis present

## 2015-05-15 DIAGNOSIS — O403XX0 Polyhydramnios, third trimester, not applicable or unspecified: Secondary | ICD-10-CM | POA: Insufficient documentation

## 2015-05-15 DIAGNOSIS — O0993 Supervision of high risk pregnancy, unspecified, third trimester: Secondary | ICD-10-CM

## 2015-05-15 DIAGNOSIS — Z3A33 33 weeks gestation of pregnancy: Secondary | ICD-10-CM

## 2015-05-15 HISTORY — DX: Gestational diabetes mellitus in pregnancy, unspecified control: O24.419

## 2015-05-15 LAB — POCT URINALYSIS DIP (DEVICE)
Bilirubin Urine: NEGATIVE
Glucose, UA: NEGATIVE mg/dL
KETONES UR: NEGATIVE mg/dL
Leukocytes, UA: NEGATIVE
NITRITE: NEGATIVE
PH: 5.5 (ref 5.0–8.0)
PROTEIN: NEGATIVE mg/dL
Specific Gravity, Urine: <=1.005 (ref 1.005–1.030)
Urobilinogen, UA: 0.2 mg/dL (ref 0.0–1.0)

## 2015-05-15 NOTE — Progress Notes (Signed)
Subjective:  Stephanie Wells is a 35 y.o. [id number] at 7126w2d being seen today for ongoing prenatal care.  She is currently monitored for the following issues for this high-risk pregnancy and has Supervision of high-risk pregnancy; Gestational diabetes; History of pre-eclampsia; AMA (advanced maternal age) multigravida 35+; Late prenatal care; Airway hyperreactivity; Allergic state; Grand multipara; and History of macrosomia in infant in prior pregnancy, currently pregnant on her problem list.  Patient reports no complaints.  Contractions: Not present. Vag. Bleeding: None.  Movement: Present. Denies leaking of fluid.   The following portions of the patient's history were reviewed and updated as appropriate: allergies, current medications, past family history, past medical history, past social history, past surgical history and problem list. Problem list updated.  Objective:   Filed Vitals:   05/15/15 1122  BP: 98/55  Pulse: 86  Weight: 239 lb 3.2 oz (108.5 kg)    Fetal Status: Fetal Heart Rate (bpm): 150 Fundal Height: 43 cm Movement: Present     General:  Alert, oriented and cooperative. Patient is in no acute distress.  Skin: Skin is warm and dry. No rash noted.   Cardiovascular: Normal heart rate noted  Respiratory: Normal respiratory effort, no problems with respiration noted  Abdomen: Soft, gravid, appropriate for gestational age. Pain/Pressure: Absent     Pelvic: Vag. Bleeding: None     Cervical exam deferred        Extremities: Normal range of motion.  Edema: None  Mental Status: Normal mood and affect. Normal behavior. Normal judgment and thought content.   Urinalysis:      Assessment and Plan:  Pregnancy: [id number] at 5326w2d  1. History of macrosomia in infant in prior pregnancy, currently pregnant, third trimester   2. Grand multipara   3. Diet-controlled gestational diabetes mellitus, unspecified trimester Patient did not bring meter and admits that she did not check CBG as  she was not feeling well  4. AMA (advanced maternal age) multigravida 35+, third trimester   5. Supervision of high-risk pregnancy, third trimester Patient with polyhydramnios. Will start twice weekly fetal testing next week. 8/8 BPP this morning  6. History of pre-eclampsia   7. Late prenatal care, third trimester   Preterm labor symptoms and general obstetric precautions including but not limited to vaginal bleeding, contractions, leaking of fluid and fetal movement were reviewed in detail with the patient. Please refer to After Visit Summary for other counseling recommendations.  Return in about 1 week (around 05/22/2015) for ob f/u with [facility].   Catalina AntiguaPeggy Genella Bas, MD

## 2015-05-15 NOTE — Progress Notes (Signed)
Pt didn't check glucose this weekend. She was sick with stomach virus.

## 2015-05-22 ENCOUNTER — Ambulatory Visit (INDEPENDENT_AMBULATORY_CARE_PROVIDER_SITE_OTHER): Payer: Medicaid Other | Admitting: Family Medicine

## 2015-05-22 ENCOUNTER — Other Ambulatory Visit (HOSPITAL_COMMUNITY)
Admission: RE | Admit: 2015-05-22 | Discharge: 2015-05-22 | Disposition: A | Discharge status: Home / Self Care | Payer: Medicaid Other | Source: Ambulatory Visit | Attending: Family Medicine | Admitting: Family Medicine

## 2015-05-22 VITALS — BP 122/65 | HR 73 | Temp 97.9°F | Wt 240.0 lb

## 2015-05-22 DIAGNOSIS — O403XX0 Polyhydramnios, third trimester, not applicable or unspecified: Secondary | ICD-10-CM | POA: Diagnosis not present

## 2015-05-22 DIAGNOSIS — Z113 Encounter for screening for infections with a predominantly sexual mode of transmission: Secondary | ICD-10-CM | POA: Insufficient documentation

## 2015-05-22 DIAGNOSIS — O09293 Supervision of pregnancy with other poor reproductive or obstetric history, third trimester: Secondary | ICD-10-CM | POA: Diagnosis not present

## 2015-05-22 DIAGNOSIS — O0933 Supervision of pregnancy with insufficient antenatal care, third trimester: Secondary | ICD-10-CM

## 2015-05-22 DIAGNOSIS — Z641 Problems related to multiparity: Secondary | ICD-10-CM

## 2015-05-22 DIAGNOSIS — O24419 Gestational diabetes mellitus in pregnancy, unspecified control: Secondary | ICD-10-CM | POA: Diagnosis present

## 2015-05-22 DIAGNOSIS — O0993 Supervision of high risk pregnancy, unspecified, third trimester: Secondary | ICD-10-CM

## 2015-05-22 DIAGNOSIS — Z8759 Personal history of other complications of pregnancy, childbirth and the puerperium: Secondary | ICD-10-CM

## 2015-05-22 DIAGNOSIS — O403XX1 Polyhydramnios, third trimester, fetus 1: Secondary | ICD-10-CM

## 2015-05-22 DIAGNOSIS — O09523 Supervision of elderly multigravida, third trimester: Secondary | ICD-10-CM

## 2015-05-22 LAB — GLUCOSE, CAPILLARY: GLUCOSE-CAPILLARY: 97 mg/dL (ref 65–99)

## 2015-05-22 LAB — POCT URINALYSIS DIP (DEVICE)
BILIRUBIN URINE: NEGATIVE
GLUCOSE, UA: NEGATIVE mg/dL
Ketones, ur: NEGATIVE mg/dL
Leukocytes, UA: NEGATIVE
NITRITE: NEGATIVE
PH: 6 (ref 5.0–8.0)
Protein, ur: NEGATIVE mg/dL
Specific Gravity, Urine: 1.01 (ref 1.005–1.030)
Urobilinogen, UA: 0.2 mg/dL (ref 0.0–1.0)

## 2015-05-22 LAB — OB RESULTS CONSOLE GBS: GBS: NEGATIVE

## 2015-05-22 NOTE — Patient Instructions (Signed)
Gestational Diabetes Mellitus  Gestational diabetes mellitus, often simply referred to as gestational diabetes, is a type of diabetes that some women develop during pregnancy. In gestational diabetes, the pancreas does not make enough insulin (a hormone), the cells are less responsive to the insulin that is made (insulin resistance), or both. Normally, insulin moves sugars from food into the tissue cells. The tissue cells use the sugars for energy. The lack of insulin or the lack of normal response to insulin causes excess sugars to build up in the blood instead of going into the tissue cells. As a result, high blood sugar (hyperglycemia) develops. The effect of high sugar (glucose) levels can cause many problems.   RISK FACTORS  You have an increased chance of developing gestational diabetes if you have a family history of diabetes and also have one or more of the following risk factors:  · A body mass index over 30 (obesity).  · A previous pregnancy with gestational diabetes.  · An older age at the time of pregnancy.  If blood glucose levels are kept in the normal range during pregnancy, women can have a healthy pregnancy. If your blood glucose levels are not well controlled, there may be risks to you, your unborn baby (fetus), your labor and delivery, or your newborn baby.   SYMPTOMS   If symptoms are experienced, they are much like symptoms you would normally expect during pregnancy. The symptoms of gestational diabetes include:   · Increased thirst (polydipsia).  · Increased urination (polyuria).  · Increased urination during the night (nocturia).  · Weight loss. This weight loss may be rapid.  · Frequent, recurring infections.  · Tiredness (fatigue).  · Weakness.  · Vision changes, such as blurred vision.  · Fruity smell to your breath.  · Abdominal pain.  DIAGNOSIS  Diabetes is diagnosed when blood glucose levels are increased. Your blood glucose level may be checked by one or more of the following blood  tests:  · A fasting blood glucose test. You will not be allowed to eat for at least 8 hours before a blood sample is taken.  · A random blood glucose test. Your blood glucose is checked at any time of the day regardless of when you ate.  · An oral glucose tolerance test (OGTT). Your blood glucose is measured after you have not eaten (fasted) for 1-3 hours and then after you drink a glucose-containing beverage. Since the hormones that cause insulin resistance are highest at about 24-28 weeks of a pregnancy, an OGTT is usually performed during that time. If you have risk factors, you may be screened for undiagnosed type 2 diabetes at your first prenatal visit.  TREATMENT   Gestational diabetes should be managed first with diet and exercise. Medicines may be added only if they are needed.  · You will need to take diabetes medicine or insulin daily to keep blood glucose levels in the desired range.  · You will need to match insulin dosing with exercise and healthy food choices.  If you have gestational diabetes, your treatment goal is to maintain the following blood glucose levels:  · Before meals (preprandial): at or below 95 mg/dL.  · After meals (postprandial):    One hour after a meal: at or below 140 mg/dL.    Two hours after a meal: at or below 120 mg/dL.  If you have pre-existing type 1 or type 2 diabetes, your treatment goal is to maintain the following blood glucose levels:  · Before   meals, at bedtime, and overnight: 60-99 mg/dL.  · After meals: peak of 100-129 mg/dL.  HOME CARE INSTRUCTIONS   · Have your hemoglobin A1c level checked twice a year.  · Perform daily blood glucose monitoring as directed by your health care provider. It is common to perform frequent blood glucose monitoring.  · Monitor urine ketones when you are ill and as directed by your health care provider.  · Take your diabetes medicine and insulin as directed by your health care provider to maintain your blood glucose level in the desired  range.  ¨ Never run out of diabetes medicine or insulin. It is needed every day.  ¨ Adjust insulin based on your intake of carbohydrates. Carbohydrates can raise blood glucose levels but need to be included in your diet. Carbohydrates provide vitamins, minerals, and fiber which are an essential part of a healthy diet. Carbohydrates are found in fruits, vegetables, whole grains, dairy products, legumes, and foods containing added sugars.  · Eat healthy foods. Alternate 3 meals with 3 snacks.  · Maintain a healthy weight gain. The usual total expected weight gain varies according to your prepregnancy body mass index (BMI).  · Carry a medical alert card or wear your medical alert jewelry.  · Carry a 15-gram carbohydrate snack with you at all times to treat low blood glucose (hypoglycemia). Some examples of 15-gram carbohydrate snacks include:  ¨ Glucose tablets, 3 or 4.  ¨ Glucose gel, 15-gram tube.  ¨ Raisins, 2 tablespoons (24 g).  ¨ Jelly beans, 6.  ¨ Animal crackers, 8.  ¨ Fruit juice, regular soda, or low-fat milk, 4 ounces (120 mL).  ¨ Gummy treats, 9.  · Recognize hypoglycemia. Hypoglycemia during pregnancy occurs with blood glucose levels of 60 mg/dL and below. The risk for hypoglycemia increases when fasting or skipping meals, during or after intense exercise, and during sleep. Hypoglycemia symptoms can include:  ¨ Tremors or shakes.  ¨ Decreased ability to concentrate.  ¨ Sweating.  ¨ Increased heart rate.  ¨ Headache.  ¨ Dry mouth.  ¨ Hunger.  ¨ Irritability.  ¨ Anxiety.  ¨ Restless sleep.  ¨ Altered speech or coordination.  ¨ Confusion.  · Treat hypoglycemia promptly. If you are alert and able to safely swallow, follow the 15:15 rule:  ¨ Take 15-20 grams of rapid-acting glucose or carbohydrate. Rapid-acting options include glucose gel, glucose tablets, or 4 ounces (120 mL) of fruit juice, regular soda, or low-fat milk.  ¨ Check your blood glucose level 15 minutes after taking the glucose.  ¨ Take 15-20  grams more of glucose if the repeat blood glucose level is still 70 mg/dL or below.  ¨ Eat a meal or snack within 1 hour once blood glucose levels return to normal.  · Be alert to polyuria (excess urination) and polydipsia (excess thirst) which are early signs of hyperglycemia. An early awareness of hyperglycemia allows for prompt treatment. Treat hyperglycemia as directed by your health care provider.  · Engage in at least 30 minutes of physical activity a day or as directed by your health care provider. Ten minutes of physical activity timed 30 minutes after each meal is encouraged to control postprandial blood glucose levels.  · Adjust your insulin dosing and food intake as needed if you start a new exercise or sport.  · Follow your sick-day plan at any time you are unable to eat or drink as usual.  · Avoid tobacco and alcohol use.  · Keep all follow-up visits as directed   by your health care provider.  · Follow the advice of your health care provider regarding your prenatal and post-delivery (postpartum) appointments, meal planning, exercise, medicines, vitamins, blood tests, other medical tests, and physical activities.  · Perform daily skin and foot care. Examine your skin and feet daily for cuts, bruises, redness, nail problems, bleeding, blisters, or sores.  · Brush your teeth and gums at least twice a day and floss at least once a day. Follow up with your dentist regularly.  · Schedule an eye exam during the first trimester of your pregnancy or as directed by your health care provider.  · Share your diabetes management plan with your workplace or school.  · Stay up-to-date with immunizations.  · Learn to manage stress.  · Obtain ongoing diabetes education and support as needed.  · Learn about and consider breastfeeding your baby.  · You should have your blood sugar level checked 6-12 weeks after delivery. This is done with an oral glucose tolerance test (OGTT).  SEEK MEDICAL CARE IF:   · You are unable to  eat food or drink fluids for more than 6 hours.  · You have nausea and vomiting for more than 6 hours.  · You have a blood glucose level of 200 mg/dL and you have ketones in your urine.  · There is a change in mental status.  · You develop vision problems.  · You have a persistent headache.  · You have upper abdominal pain or discomfort.  · You develop an additional serious illness.  · You have diarrhea for more than 6 hours.  · You have been sick or have had a fever for a couple of days and are not getting better.  SEEK IMMEDIATE MEDICAL CARE IF:   · You have difficulty breathing.  · You no longer feel the baby moving.  · You are bleeding or have discharge from your vagina.  · You start having premature contractions or labor.  MAKE SURE YOU:  · Understand these instructions.  · Will watch your condition.  · Will get help right away if you are not doing well or get worse.     This information is not intended to replace advice given to you by your health care provider. Make sure you discuss any questions you have with your health care provider.     Document Released: 05/27/2000 Document Revised: 03/11/2014 Document Reviewed: 09/17/2011  Elsevier Interactive Patient Education ©2016 [employer]

## 2015-05-22 NOTE — Addendum Note (Signed)
Addended by: Jill SideAY, Froilan Mclean L on: 05/22/2015 03:55 PM   Modules accepted: Orders

## 2015-05-22 NOTE — Progress Notes (Signed)
Breastfeeding tip of the week reviewed Cult[employer] today 

## 2015-05-22 NOTE — Progress Notes (Signed)
Subjective:  Stephanie Wells is a 35 y.o. [id number] at [redacted]w[redacted]d being seen today for ongoing prenatal care.  She is currently monitored for the following issues for this high-risk pregnancy and has Supervision of high-risk pregnancy; Gestational diabetes; History of pre-eclampsia; AMA (advanced maternal age) multigravida 35+; Late prenatal care; Airway hyperreactivity; Allergic state; Grand multipara; History of macrosomia in infant in prior pregnancy, currently pregnant; and Polyhydramnios in third trimester, antepartum on her problem list.  Patient reports no complaints.  Contractions: Not present. Vag. Bleeding: None.  Movement: Present. Denies leaking of fluid.   The following portions of the patient's history were reviewed and updated as appropriate: allergies, current medications, past family history, past medical history, past social history, past surgical history and problem list. Problem list updated.  Objective:   Filed Vitals:   05/22/15 0952  BP: 122/65  Pulse: 73  Temp: 97.9 F (36.6 C)  Weight: 240 lb (108.863 kg)    Fetal Status: Fetal Heart Rate (bpm): 159 Fundal Height: 40 cm Movement: Present  Presentation: Transverse (head on maternal Rt, spine down)  General:  Alert, oriented and cooperative. Patient is in no acute distress.  Skin: Skin is warm and dry. No rash noted.   Cardiovascular: Normal heart rate noted  Respiratory: Normal respiratory effort, no problems with respiration noted  Abdomen: Soft, gravid, appropriate for gestational age. Pain/Pressure: Absent     Pelvic: Vag. Bleeding: None     Cervical exam deferred        Extremities: Normal range of motion.  Edema: Trace  Mental Status: Normal mood and affect. Normal behavior. Normal judgment and thought content.   Urinalysis: Urine Protein: Negative Urine Glucose: Negative  Assessment and Plan:  Pregnancy: [id number] at 3746w2d  1. Gestational diabetes mellitus in third trimester, unspecified diabetic control Start  2x weekly testing Did not bring her glucose log today CBG today= 97 which is fasting  2. AMA (advanced maternal age) multigravida 35+, third trimester Declined testing  3. Grand multipara High risk for postpartum hemorrhage  4. History of macrosomia in infant in prior pregnancy, currently pregnant, third trimester H/o 9#15oz baby vaginally  5. History of pre-eclampsia BP well controlled today  6. Late prenatal care, third trimester  7. Supervision of high-risk pregnancy, third trimester Collected cultures today  8. Polyhydramnios NST today Weekly AFI, 2x weekly NST  Preterm labor symptoms and general obstetric precautions including but not limited to vaginal bleeding, contractions, leaking of fluid and fetal movement were reviewed in detail with the patient. Please refer to After Visit Summary for other counseling recommendations.  Return in about 2 weeks (around 06/05/2015) for NST x2 weekly with weekly AFI.  Future Appointments Date Time Provider Department Center  05/25/2015 8:30 AM [facility] NST [facility] WOC  05/29/2015 9:05 AM Fed[facility]akeKimberly Niles [facility]  05/29/2015 10:30 AM WH-MFC US 2 WH-US 203  06/01/2015 8:30 AM WOC-[facility]OCA NST [facility] WOC  06/05/2015 7:30 AM WOC-[facility]OCA NST WOC-[facility]A WOC    Federico FlakeKimberly Niles Newton, MD

## 2015-05-22 NOTE — Progress Notes (Signed)
Random CBG = 97

## 2015-05-23 LAB — GC/CHLAMYDIA PROBE AMP (~~LOC~~) NOT AT ARMC
CHLAMYDIA, DNA PROBE: NEGATIVE
Neisseria Gonorrhea: NEGATIVE

## 2015-05-24 LAB — CULTURE, BETA STREP (GROUP B ONLY)

## 2015-05-25 ENCOUNTER — Ambulatory Visit (INDEPENDENT_AMBULATORY_CARE_PROVIDER_SITE_OTHER): Payer: Medicaid Other | Admitting: *Deleted

## 2015-05-25 VITALS — BP 118/68 | HR 82

## 2015-05-25 DIAGNOSIS — O24419 Gestational diabetes mellitus in pregnancy, unspecified control: Secondary | ICD-10-CM

## 2015-05-26 NOTE — Progress Notes (Signed)
3/23 NST reviewed and reactive

## 2015-05-29 ENCOUNTER — Ambulatory Visit (INDEPENDENT_AMBULATORY_CARE_PROVIDER_SITE_OTHER): Payer: Medicaid Other | Admitting: Family Medicine

## 2015-05-29 ENCOUNTER — Ambulatory Visit (HOSPITAL_COMMUNITY)
Admission: RE | Admit: 2015-05-29 | Discharge: 2015-05-29 | Disposition: A | Discharge status: Home / Self Care | Payer: Medicaid Other | Source: Ambulatory Visit | Attending: Family Medicine | Admitting: Family Medicine

## 2015-05-29 ENCOUNTER — Encounter (HOSPITAL_COMMUNITY): Payer: Self-pay

## 2015-05-29 ENCOUNTER — Other Ambulatory Visit (HOSPITAL_COMMUNITY): Payer: Self-pay | Admitting: Maternal and Fetal Medicine

## 2015-05-29 VITALS — BP 130/53 | HR 94 | Wt 242.3 lb

## 2015-05-29 DIAGNOSIS — O09523 Supervision of elderly multigravida, third trimester: Secondary | ICD-10-CM | POA: Insufficient documentation

## 2015-05-29 DIAGNOSIS — O403XX0 Polyhydramnios, third trimester, not applicable or unspecified: Secondary | ICD-10-CM

## 2015-05-29 DIAGNOSIS — Z8632 Personal history of gestational diabetes: Secondary | ICD-10-CM

## 2015-05-29 DIAGNOSIS — Z3A36 36 weeks gestation of pregnancy: Secondary | ICD-10-CM | POA: Diagnosis not present

## 2015-05-29 DIAGNOSIS — Z8759 Personal history of other complications of pregnancy, childbirth and the puerperium: Secondary | ICD-10-CM

## 2015-05-29 DIAGNOSIS — O403XX1 Polyhydramnios, third trimester, fetus 1: Secondary | ICD-10-CM

## 2015-05-29 DIAGNOSIS — O09293 Supervision of pregnancy with other poor reproductive or obstetric history, third trimester: Secondary | ICD-10-CM | POA: Insufficient documentation

## 2015-05-29 DIAGNOSIS — Z641 Problems related to multiparity: Secondary | ICD-10-CM | POA: Diagnosis not present

## 2015-05-29 DIAGNOSIS — O24415 Gestational diabetes mellitus in pregnancy, controlled by oral hypoglycemic drugs: Secondary | ICD-10-CM

## 2015-05-29 DIAGNOSIS — O0933 Supervision of pregnancy with insufficient antenatal care, third trimester: Secondary | ICD-10-CM | POA: Diagnosis not present

## 2015-05-29 DIAGNOSIS — O24410 Gestational diabetes mellitus in pregnancy, diet controlled: Secondary | ICD-10-CM

## 2015-05-29 DIAGNOSIS — O0930 Supervision of pregnancy with insufficient antenatal care, unspecified trimester: Secondary | ICD-10-CM

## 2015-05-29 DIAGNOSIS — O0993 Supervision of high risk pregnancy, unspecified, third trimester: Secondary | ICD-10-CM

## 2015-05-29 DIAGNOSIS — O09299 Supervision of pregnancy with other poor reproductive or obstetric history, unspecified trimester: Secondary | ICD-10-CM

## 2015-05-29 LAB — POCT URINALYSIS DIP (DEVICE)
Bilirubin Urine: NEGATIVE
GLUCOSE, UA: NEGATIVE mg/dL
Hgb urine dipstick: NEGATIVE
KETONES UR: NEGATIVE mg/dL
LEUKOCYTES UA: NEGATIVE
NITRITE: NEGATIVE
PH: 6 (ref 5.0–8.0)
Protein, ur: NEGATIVE mg/dL
Specific Gravity, Urine: 1.01 (ref 1.005–1.030)
Urobilinogen, UA: 0.2 mg/dL (ref 0.0–1.0)

## 2015-05-29 IMAGING — US US MFM OB FOLLOW-UP
1 series · 14 of 28 positions shown · non-contrast
Comparison: none

[Series 1: us mfm ob follow-up · 69 acquisitions, 14 frames shown]
[im 3/69]
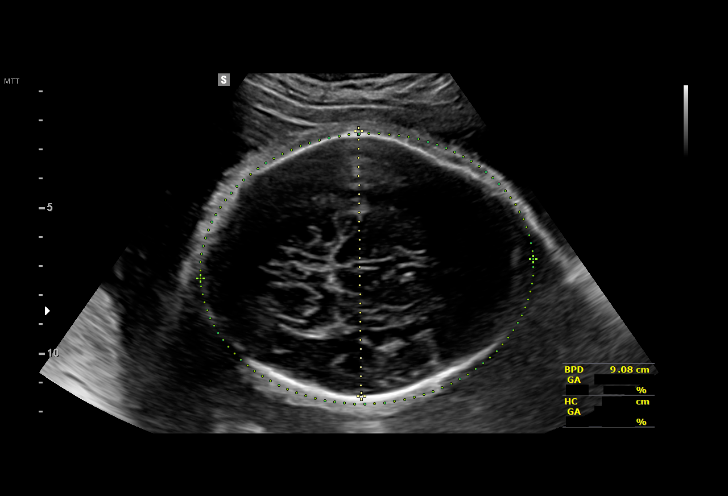
[im 8/69]
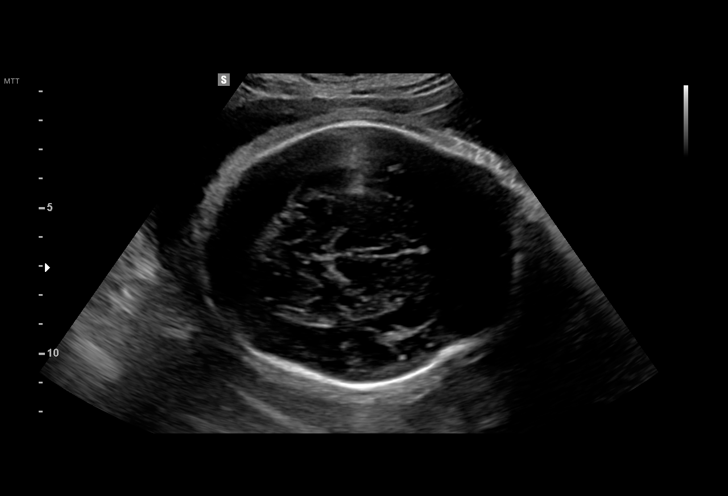
[im 13/69]
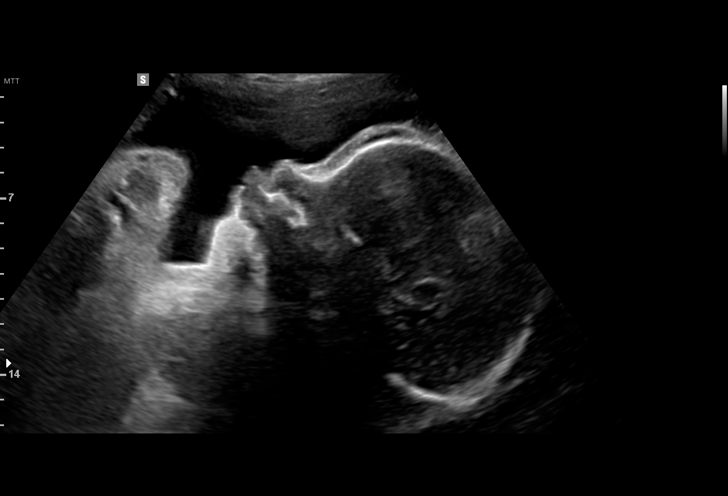
[im 18/69]
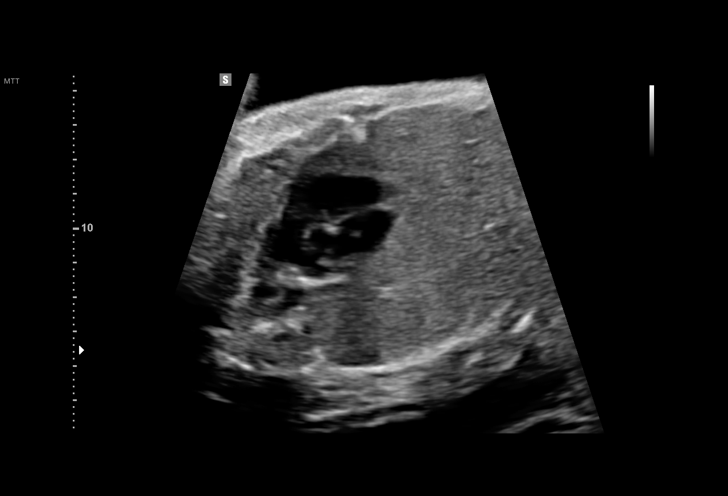
[im 23/69]
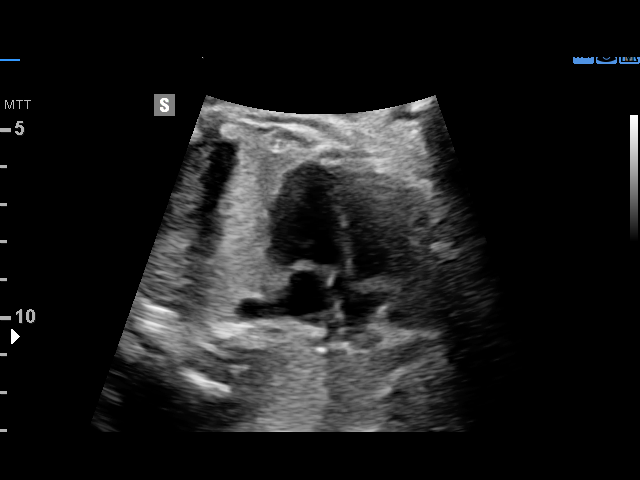
[im 28/69]
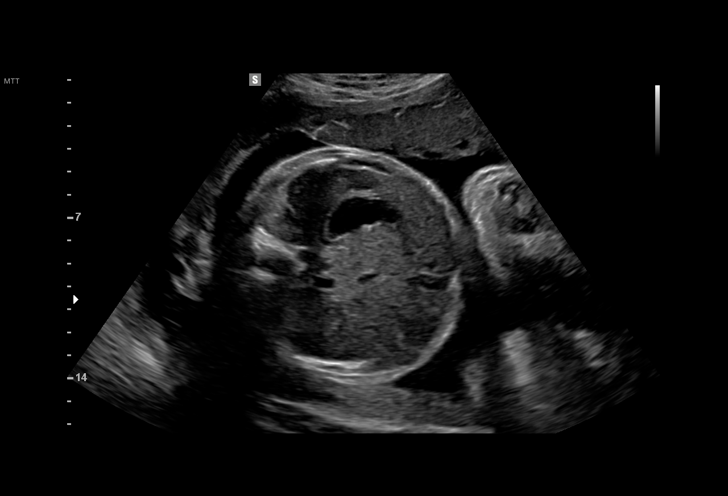
[im 33/69]
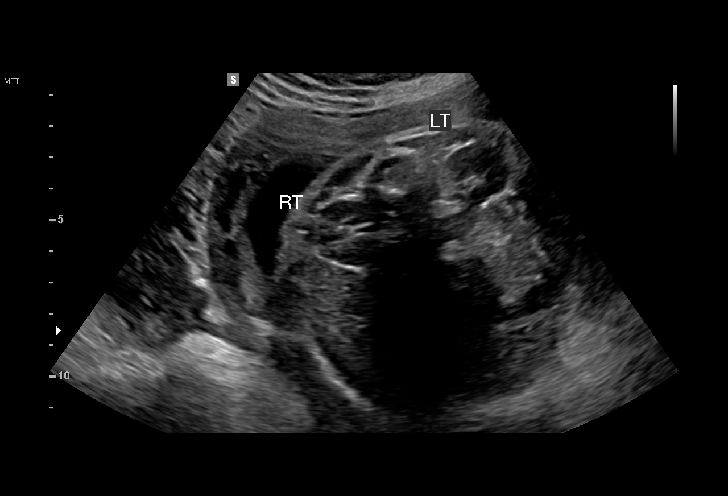
[im 38/69]
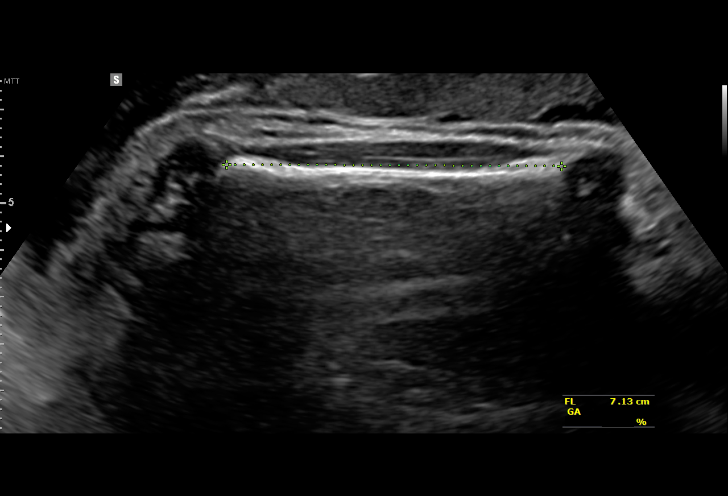
[im 43/69]
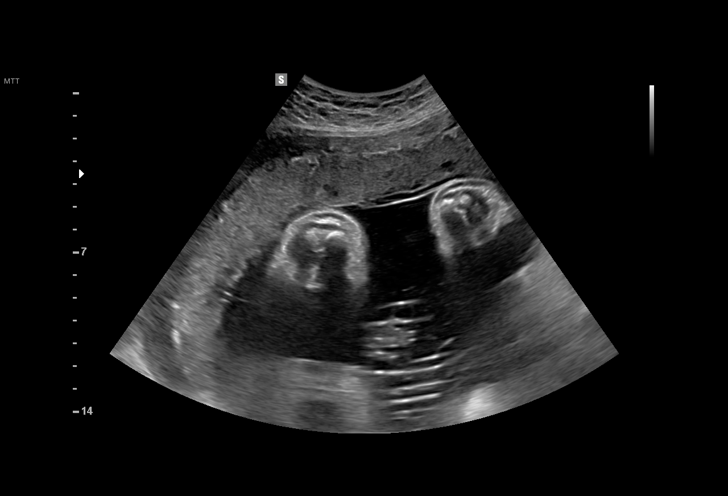
[im 48/69]
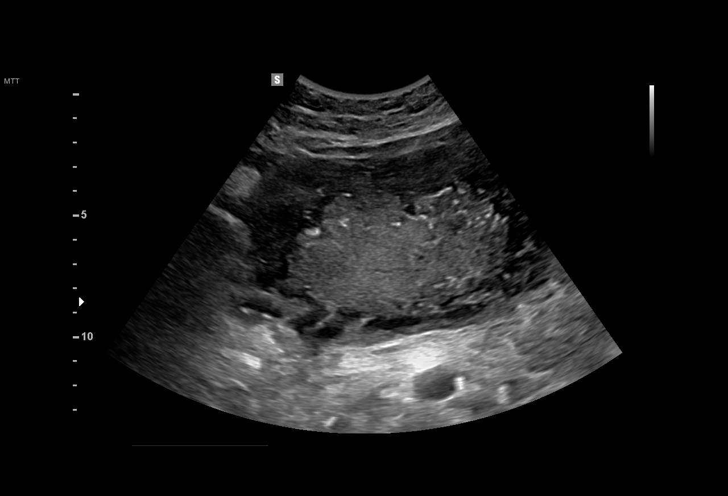
[im 53/69]
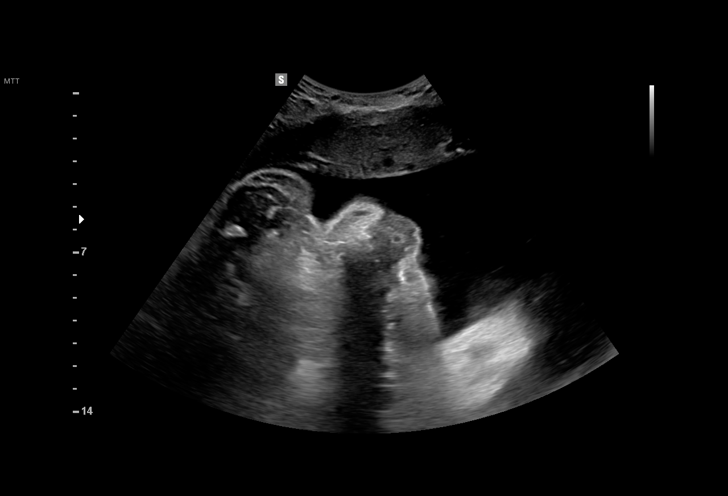
[im 58/69]
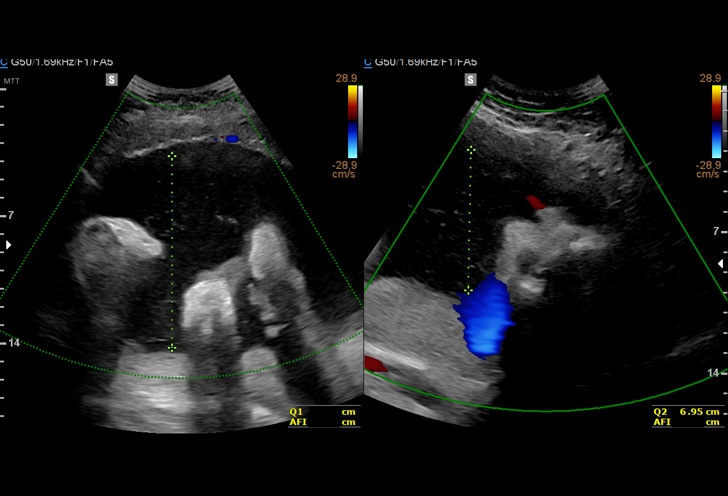
[im 63/69]
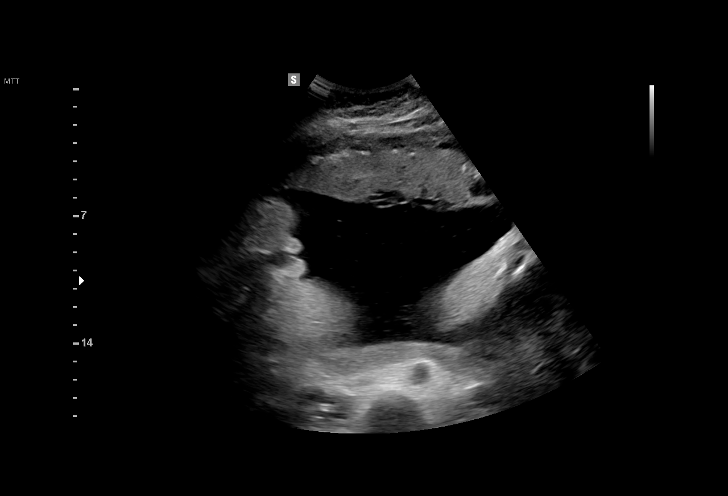
[im 69/69]
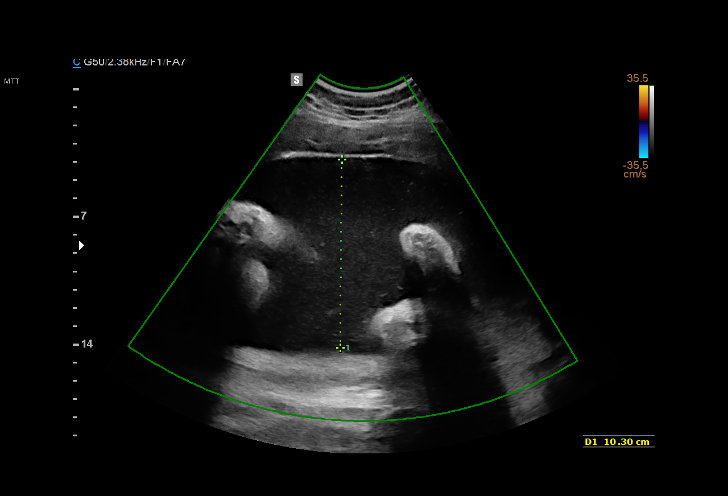

[14 of 28 positions shown; findings below may reference images not displayed]

pm)

OB/Gyn Clinic
[REDACTED]-
Faculty Physician

1  CATIIA            [PHONE_NUMBER]      [PHONE_NUMBER]     [PHONE_NUMBER]
Indications

36 weeks gestation of pregnancy
Polyhydramnios, third trimester, antepartum    [WQ]
condition or complication, unspecified fetus
Advanced maternal age multigravida 35+,        [WQ]
third trimester
Gestational diabetes in pregnancy, diet        [WQ]
controlled
Late to prenatal care, third trimester         [WQ]
Poor obstetric history: Previous gestational   [WQ]
diabetes
Poor obstetric history: Previous preeclampsia  [WQ]
OB History

Gravidity:    7         Term:   5         SAB:   1
Living:       5
Fetal Evaluation

Num Of Fetuses:     1
Cardiac Activity:   Observed
Presentation:       Cephalic
Placenta:           Anterior Fundal, above cervical os
P. Cord Insertion:  Previously Visualized

Amniotic Fluid
AFI FV:      Mild polyhydramnios
AFI Sum:     28.65    cm    > 97  %Tile      Larg Pckt:  10.54  cm
RUQ:   10.54   cm   RLQ:    6.08   cm    LUQ:   6.95    cm    LLQ:   5.08    cm
Biometry

BPD:      90.3  mm     G. Age:  36w 4d                  CI:         76.66  %    70 - 86
FL/HC:       22.2  %    20.1 -
HC:      326.7  mm     G. Age:  37w 0d         40  %    HC/AC:       0.97       0.93 -
AC:      335.3  mm     G. Age:  37w 3d         87  %    FL/BPD:      80.2  %    71 - 87
FL:       72.4  mm     G. Age:  37w 1d         66  %    FL/AC:       21.6  %    20 - 24
HUM:      62.2  mm     G. Age:  36w 0d         60  %
CER:      48.6  mm     G. Age:  N/A          > 95  %

Est. FW:    [WQ]   gm    6 lb 15 oz     81  %
Gestational Age

U/S Today:     37w 0d                                        EDD:    [DATE]
Best:          36w 2d     Det. By:  Previous Ultrasound      EDD:    [DATE]
Anatomy

Cranium:          Appears normal         Aortic Arch:      Previously seen
Fetal Cavum:      Appears normal         Ductal Arch:      Previously seen
Ventricles:       Appears normal         Diaphragm:        Appears normal
Choroid Plexus:   Previously seen        Stomach:          Appears normal, left
sided
Cerebellum:       Appears normal         Abdomen:          Previously seen
Posterior Fossa:  Previously seen        Abdominal Wall:   Previously seen
Nuchal Fold:      Not applicable (>20    Cord Vessels:     Previously seen
wks GA)
Face:             Profile nl; orbits     Kidneys:          Appear normal
prev. visualized
Lips:             Previously seen        Bladder:          Appears normal
Fetal Thoracic:   Appears normal         Spine:            Previously seen
Heart:            Appears normal         Upper             Previously seen
(4CH, axis, and        Extremities:
situs)
RVOT:             Appears normal         Lower             Previously seen
Extremities:
LVOT:             Appears normal

Other:  Fetus appears to be a female. Technically difficult due to advanced
gestational age. Heels visualized prev.
Cervix Uterus Adnexa

Cervix
Not visualized (advanced GA >[WQ])

Uterus
No abnormality visualized.

Left Ovary
Size(cm)     2.95   x   1.91   x  1.46      Vol(ml):
Within normal limits. No adnexal mass visualized.

Right Ovary
Size(cm)     1.78   x   2.88   x  1.76      Vol(ml):
Within normal limits. No adnexal mass visualized.

Cul De Sac:   No free fluid seen.

Adnexa:       No adnexal mass visualized.
Impression

SIUP at 36+2 weeks
Normal interval anatomy; anatomic survey complete
Mild polyhydramnios
Appropriate interval growth with EFW at the 81st %ti
Recommendations

Continue twice weekly NSTs with weekly AFIs

## 2015-05-29 MED ORDER — GLYBURIDE 5 MG PO TABS: 5.0000 mg | ORAL_TABLET | Freq: Every day | ORAL | Status: DC

## 2015-05-29 NOTE — Progress Notes (Signed)
Subjective:  Stephanie DinningJessica Hopman is a 35 y.o. [id number] at 5662w2d being seen today for ongoing prenatal care.  She is currently monitored for the following issues for this high-risk pregnancy and has Supervision of high-risk pregnancy; Gestational diabetes; History of pre-eclampsia; AMA (advanced maternal age) multigravida 35+; Late prenatal care; Airway hyperreactivity; Allergic state; Grand multipara; History of macrosomia in infant in prior pregnancy, currently pregnant; and Polyhydramnios in third trimester, antepartum on her problem list.  Blood sugar log: 100% of fasting values are >90; highest value 119.  Highest pp 146; otherwise only 3 others elevated. Diet-controlled.  Heartburn makes her eat at night; knows she might overdue it with eating. Eats crackers and peanuts; drinks ginger ale.    Patient reports no complaints.  Contractions: Irregular. Vag. Bleeding: None.  Movement: Present. Denies leaking of fluid.   The following portions of the patient's history were reviewed and updated as appropriate: allergies, current medications, past family history, past medical history, past social history, past surgical history and problem list. Problem list updated.  Objective:   Filed Vitals:   05/29/15 0929  BP: 130/53  Pulse: 94  Weight: 242 lb 4.8 oz (109.907 kg)    Fetal Status: Fetal Heart Rate (bpm): NST Fundal Height: 42 cm Movement: Present     General:  Alert, oriented and cooperative. Patient is in no acute distress.  Skin: Skin is warm and dry. No rash noted.   Cardiovascular: Normal heart rate noted  Respiratory: Normal respiratory effort, no problems with respiration noted  Abdomen: Soft, gravid, appropriate for gestational age. Pain/Pressure: Present     Pelvic: Vag. Bleeding: None Vag D/C Character: Mucous   Cervical exam deferred        Extremities: Normal range of motion.  Edema: Trace  Mental Status: Normal mood and affect. Normal behavior. Normal judgment and thought content.    Urinalysis:      Assessment and Plan:  Pregnancy: [id number] at 6462w2d  1. Polyhydramnios in third trimester, antepartum, fetus 1 Follow-up US today. Also had NST.  - Fetal nonstress test  2. Supervision of high-risk pregnancy, third trimester Continue routine prenatal care.   3. History of macrosomia in infant in prior pregnancy, currently pregnant, unspecified trimester US today to determine weight.  4. Grand multipara  5. AMA (advanced maternal age) multigravida 35+, third trimester  6. Diet controlled gestational diabetes mellitus in third trimester Now A2GDM. Will start glyburide 5mg  at bedtime to help with fasting blood sugars. Counseled on nighttime eating.  - glyBURIDE (DIABETA) 5 MG tablet; Take 1 tablet (5 mg total) by mouth at bedtime.  Dispense: 30 tablet; Refill: 1   Preterm labor symptoms and general obstetric precautions including but not limited to vaginal bleeding, contractions, leaking of fluid and fetal movement were reviewed in detail with the patient. Please refer to After Visit Summary for other counseling recommendations.  Return in about 3 days (around 06/01/2015) for 2x/wk as scheduled.   Pincus LargeJazma Y Avrohom Mckelvin, DO

## 2015-05-29 NOTE — Progress Notes (Signed)
US for growth today @ 1030

## 2015-05-29 NOTE — Progress Notes (Signed)
OB fellow attestation:  I have seen and examined this patient; I agree with above documentation in the resident's note.  Patient now has A2GDM- start QHS glyburide. Already in 2x weekly testing. Growth US schedule today to get EFW.   Federico FlakeKimberly Niles Newton, MD 12:33 PM

## 2015-05-29 NOTE — Patient Instructions (Signed)
Labor Induction Labor induction is when steps are taken to cause a pregnant woman to begin the labor process. Most women go into labor on their own between 37 weeks and 42 weeks of the pregnancy. When this does not happen or when there is a medical need, methods may be used to induce labor. Labor induction causes a pregnant woman's uterus to contract. It also causes the cervix to soften (ripen), open (dilate), and thin out (efface). Usually, labor is not induced before 39 weeks of the pregnancy unless there is a problem with the baby or mother.  Before inducing labor, your health care provider will consider a number of factors, including the following:  The medical condition of you and the baby.   How many weeks along you are.   The status of the baby's lung maturity.   The condition of the cervix.   The position of the baby.  WHAT ARE THE REASONS FOR LABOR INDUCTION? Labor may be induced for the following reasons:  The health of the baby or mother is at risk.   The pregnancy is overdue by 1 week or more.   The water breaks but labor does not start on its own.   The mother has a health condition or serious illness, such as high blood pressure, infection, placental abruption, or diabetes.  The amniotic fluid amounts are low around the baby.   The baby is distressed.  Convenience or wanting the baby to be born on a certain date is not a reason for inducing labor. WHAT METHODS ARE USED FOR LABOR INDUCTION? Several methods of labor induction may be used, such as:   Prostaglandin medicine. This medicine causes the cervix to dilate and ripen. The medicine will also start contractions. It can be taken by mouth or by inserting a suppository into the vagina.   Inserting a thin tube (catheter) with a balloon on the end into the vagina to dilate the cervix. Once inserted, the balloon is expanded with water, which causes the cervix to open.   Stripping the membranes. Your health  care provider separates amniotic sac tissue from the cervix, causing the cervix to be stretched and causing the release of a hormone called progesterone. This may cause the uterus to contract. It is often done during an office visit. You will be sent home to wait for the contractions to begin. You will then come in for an induction.   Breaking the water. Your health care provider makes a hole in the amniotic sac using a small instrument. Once the amniotic sac breaks, contractions should begin. This may still take hours to see an effect.   Medicine to trigger or strengthen contractions. This medicine is given through an IV access tube inserted into a vein in your arm.  All of the methods of induction, besides stripping the membranes, will be done in the hospital. Induction is done in the hospital so that you and the baby can be carefully monitored.  HOW LONG DOES IT TAKE FOR LABOR TO BE INDUCED? Some inductions can take up to 2-3 days. Depending on the cervix, it usually takes less time. It takes longer when you are induced early in the pregnancy or if this is your first pregnancy. If a mother is still pregnant and the induction has been going on for 2-3 days, either the mother will be sent home or a cesarean delivery will be needed. WHAT ARE THE RISKS ASSOCIATED WITH LABOR INDUCTION? Some of the risks of induction include:     Changes in fetal heart rate, such as too high, too low, or erratic.   Fetal distress.   Chance of infection for the mother and baby.   Increased chance of having a cesarean delivery.   Breaking off (abruption) of the placenta from the uterus (rare).   Uterine rupture (very rare).  When induction is needed for medical reasons, the benefits of induction may outweigh the risks. WHAT ARE SOME REASONS FOR NOT INDUCING LABOR? Labor induction should not be done if:   It is shown that your baby does not tolerate labor.   You have had previous surgeries on your  uterus, such as a myomectomy or the removal of fibroids.   Your placenta lies very low in the uterus and blocks the opening of the cervix (placenta previa).   Your baby is not in a head-down position.   The umbilical cord drops down into the birth canal in front of the baby. This could cut off the baby's blood and oxygen supply.   You have had a previous cesarean delivery.   There are unusual circumstances, such as the baby being extremely premature.    This information is not intended to replace advice given to you by your health care provider. Make sure you discuss any questions you have with your health care provider.   Document Released: 07/10/2006 Document Revised: 03/11/2014 Document Reviewed: 09/17/2012 Elsevier Interactive Patient Education 2016 Elsevier Inc.  

## 2015-06-01 ENCOUNTER — Other Ambulatory Visit: Payer: Medicaid Other

## 2015-06-05 ENCOUNTER — Other Ambulatory Visit: Payer: Medicaid Other

## 2015-06-05 ENCOUNTER — Telehealth: Payer: Self-pay | Admitting: General Practice

## 2015-06-05 ENCOUNTER — Ambulatory Visit (INDEPENDENT_AMBULATORY_CARE_PROVIDER_SITE_OTHER): Payer: Medicaid Other | Admitting: Obstetrics & Gynecology

## 2015-06-05 VITALS — BP 114/60 | HR 93 | Wt 244.3 lb

## 2015-06-05 DIAGNOSIS — Z36 Encounter for antenatal screening of mother: Secondary | ICD-10-CM

## 2015-06-05 DIAGNOSIS — O24415 Gestational diabetes mellitus in pregnancy, controlled by oral hypoglycemic drugs: Secondary | ICD-10-CM

## 2015-06-05 DIAGNOSIS — O0993 Supervision of high risk pregnancy, unspecified, third trimester: Secondary | ICD-10-CM

## 2015-06-05 LAB — POCT URINALYSIS DIP (DEVICE)
Bilirubin Urine: NEGATIVE
Glucose, UA: NEGATIVE mg/dL
HGB URINE DIPSTICK: NEGATIVE
Ketones, ur: NEGATIVE mg/dL
LEUKOCYTES UA: NEGATIVE
Nitrite: NEGATIVE
PH: 5.5 (ref 5.0–8.0)
Protein, ur: NEGATIVE mg/dL
SPECIFIC GRAVITY, URINE: 1.01 (ref 1.005–1.030)
UROBILINOGEN UA: 0.2 mg/dL (ref 0.0–1.0)

## 2015-06-05 NOTE — Progress Notes (Signed)
Subjective:  Stephanie Wells is a 35 y.o. G7P5015 at 7133w2d being seen today for ongoing prenatal care.  She is currently monitored for the following issues for this high-risk pregnancy and has Supervision of high-risk pregnancy; Gestational diabetes mellitus (GDM), antepartum; History of pre-eclampsia; AMA (advanced maternal age) multigravida 35+; Late prenatal care; Airway hyperreactivity; Allergic state; Grand multipara; History of macrosomia in infant in prior pregnancy, currently pregnant; and Polyhydramnios in third trimester, antepartum on her problem list.  Patient reports no significant complaints.  Contractions: Irregular. Vag. Bleeding: None.  Movement: Present. Denies leaking of fluid.   The following portions of the patient's history were reviewed and updated as appropriate: allergies, current medications, past family history, past medical history, past social history, past surgical history and problem list. Problem list updated.  Objective:   Filed Vitals:   06/05/15 0810  BP: 114/60  Pulse: 93  Weight: 244 lb 4.8 oz (110.814 kg)    Fetal Status: Fetal Heart Rate (bpm): NST Fundal Height: 43 cm Movement: Present     General:  Alert, oriented and cooperative. Patient is in no acute distress.  Skin: Skin is warm and dry. No rash noted.   Cardiovascular: Normal heart rate noted  Respiratory: Normal respiratory effort, no problems with respiration noted  Abdomen: Soft, gravid, appropriate for gestational age. Pain/Pressure: Present     Pelvic: Vag. Bleeding: None    Cervical exam deferred        Extremities: Normal range of motion.  Edema: Trace  Mental Status: Normal mood and affect. Normal behavior. Normal judgment and thought content.   Urinalysis: Urine Protein: Negative Urine Glucose: Negative CBGs, 3/7 fasting values in 100s, PP within range except for two values in 130s NST performed today was reviewed and was found to be reactive.  AFI was elevated at 34 cm, transverse  presentation.  Continue recommended antenatal testing and prenatal care. 06/05/2015  EFW 6-15 (81%), mild polyhydramnios  Assessment and Plan:  Pregnancy: [id number] at 6733w2d  1. Gestational diabetes mellitus (GDM) controlled on oral hypoglycemic drug, antepartum Increased Glyburide to 7.5 mg qhs.  Will plan for delivery at 39 weeks.  Will continue to follow up presentation. May need version/IOL same day given variable presentation. - Amniotic fluid index with NST  2. Supervision of high-risk pregnancy, third trimester Term labor symptoms and general obstetric precautions including but not limited to vaginal bleeding, contractions, leaking of fluid and fetal movement were reviewed in detail with the patient. Please refer to After Visit Summary for other counseling recommendations.  Return for 2x/week testing and OB visits as scheduled.   Tereso NewcomerUgonna A Rayner Erman, MD

## 2015-06-05 NOTE — Progress Notes (Signed)
IOL scheduled 4/15

## 2015-06-05 NOTE — Patient Instructions (Signed)
Return to clinic for any scheduled appointments or obstetric concerns, or go to [facility]U for evaluation   AREA PEDIATRIC/FAMILY PRACTICE PHYSICIANS  [facility] [address] [place], [place] [address] Phone - [phone]   Fax - [phone]  [facility] [address] [place], [place]  [address] Phone - [phone]   Fax - [phone]  [facility] [address] [place], [place]  [address] Phone - [phone]   Fax - [phone]  [facility] [address] [place], [place]  [address] Phone - [phone]   Fax - [phone]  [facility] [address], [address] [place], [place]  [address] Phone - [phone]   Fax - [phone]  [facility] [address], [address] [place], [place]  [address] Phone - [phone]   Fax - [phone]  [facility] [address] [place], [place]  [address] Phone - [phone]   Fax - [phone]  [facility] [address] [place], [place]  [address] Phone - [phone]   Fax - [phone]  [facility] [address] [place], [place]  [address] Phone - [phone]   Fax - [phone] [facility] [address] [place], [place]  [address] Phone - [phone]   Fax - [phone]  [facility] [address] [place], [place]  [address] Phone - [phone]   Fax - [phone]  [facility] [address] [place], [place]  [address] Phone - [phone]   Fax - [phone]  [facility] [address], [address] [place], [place]  [address] Phone - [phone]   Fax - [phone]  [facility] [address] [place], [place]  [address] Phone - [phone]  [facility] [address] [place], [place]  [address] Phone - [phone]   Fax - [phone]  [facility] [address] [place], [place]  [address] Phone - [phone]   Fax - [phone]  [facility] [address] [place], [place]  [address] Phone - [phone]   Fax - [phone]  [facility] [address] [place], [place]  [address] Phone - [phone]   Fax - [phone]   [facility] [address] [place], [place]  [address] Phone - [phone]   Fax - [phone]  [facility] [address] [place], [place]  [address] Phone - [phone]   Fax - [phone]  [facility] [address] [place], [place]  [address] Phone - [phone]   Fax - [phone]  [facility] [address] [place], [place]  [address] Phone - [phone]   Fax - [phone]  [facility] [address] , [place]  [address] Phone - [phone]   Fax - [phone] [facility] [address] [place], [place]  [address] Phone - [phone]   Fax - [phone]  [facility] [address] [place], [place]  [address] Phone - [phone]   Fax - [phone]  [facility] [address] [place],   [address] Phone - [phone]   Fax - [phone]    

## 2015-06-05 NOTE — Telephone Encounter (Signed)
Patient called back into front office and was informed of appt. Patient had no questions

## 2015-06-05 NOTE — Telephone Encounter (Signed)
IOL scheduled 06/17/15 @ 8am. Called patient, no answer- left message stating we are trying to reach you about an appt time, please call us back at the clinics

## 2015-06-06 ENCOUNTER — Encounter: Payer: Self-pay | Admitting: *Deleted

## 2015-06-08 ENCOUNTER — Ambulatory Visit (INDEPENDENT_AMBULATORY_CARE_PROVIDER_SITE_OTHER): Payer: Medicaid Other | Admitting: *Deleted

## 2015-06-08 DIAGNOSIS — O24415 Gestational diabetes mellitus in pregnancy, controlled by oral hypoglycemic drugs: Secondary | ICD-10-CM

## 2015-06-08 DIAGNOSIS — O403XX1 Polyhydramnios, third trimester, fetus 1: Secondary | ICD-10-CM

## 2015-06-08 DIAGNOSIS — O403XX0 Polyhydramnios, third trimester, not applicable or unspecified: Secondary | ICD-10-CM | POA: Diagnosis not present

## 2015-06-08 NOTE — Progress Notes (Signed)
NST reviewed and reactive.  Tahji [place] L. Harraway-Smith, M.D., FACOG    

## 2015-06-09 ENCOUNTER — Telehealth (HOSPITAL_COMMUNITY): Payer: Self-pay | Admitting: *Deleted

## 2015-06-09 NOTE — Telephone Encounter (Signed)
Preadmission screen  

## 2015-06-12 ENCOUNTER — Ambulatory Visit (INDEPENDENT_AMBULATORY_CARE_PROVIDER_SITE_OTHER): Payer: Medicaid Other | Admitting: Obstetrics & Gynecology

## 2015-06-12 ENCOUNTER — Encounter: Payer: Self-pay | Admitting: Obstetrics & Gynecology

## 2015-06-12 VITALS — BP 133/70 | HR 86 | Wt 249.3 lb

## 2015-06-12 DIAGNOSIS — O403XX0 Polyhydramnios, third trimester, not applicable or unspecified: Secondary | ICD-10-CM | POA: Diagnosis not present

## 2015-06-12 DIAGNOSIS — Z36 Encounter for antenatal screening of mother: Secondary | ICD-10-CM | POA: Diagnosis not present

## 2015-06-12 DIAGNOSIS — O24415 Gestational diabetes mellitus in pregnancy, controlled by oral hypoglycemic drugs: Secondary | ICD-10-CM

## 2015-06-12 DIAGNOSIS — O0933 Supervision of pregnancy with insufficient antenatal care, third trimester: Secondary | ICD-10-CM | POA: Diagnosis not present

## 2015-06-12 DIAGNOSIS — O09523 Supervision of elderly multigravida, third trimester: Secondary | ICD-10-CM | POA: Diagnosis not present

## 2015-06-12 DIAGNOSIS — O0993 Supervision of high risk pregnancy, unspecified, third trimester: Secondary | ICD-10-CM

## 2015-06-12 LAB — POCT URINALYSIS DIP (DEVICE)
Bilirubin Urine: NEGATIVE
Glucose, UA: NEGATIVE mg/dL
Hgb urine dipstick: NEGATIVE
Ketones, ur: NEGATIVE mg/dL
Leukocytes, UA: NEGATIVE
NITRITE: NEGATIVE
PH: 6 (ref 5.0–8.0)
PROTEIN: NEGATIVE mg/dL
Specific Gravity, Urine: 1.01 (ref 1.005–1.030)
Urobilinogen, UA: 0.2 mg/dL (ref 0.0–1.0)

## 2015-06-12 NOTE — Progress Notes (Signed)
Subjective:  Stephanie Wells is a 35 y.o. [id number] at 1958w2d being seen today for ongoing prenatal care.  She is currently monitored for the following issues for this high-risk pregnancy and has Supervision of high-risk pregnancy; Gestational diabetes mellitus (GDM), antepartum; History of pre-eclampsia; AMA (advanced maternal age) multigravida 35+; Late prenatal care; Airway hyperreactivity; Allergic state; Grand multipara; History of macrosomia in infant in prior pregnancy, currently pregnant; and Polyhydramnios in third trimester, antepartum on her problem list.  Patient reports occasional contractions.  Contractions: Irregular. Vag. Bleeding: None.  Movement: Present. Denies leaking of fluid.   The following portions of the patient's history were reviewed and updated as appropriate: allergies, current medications, past family history, past medical history, past social history, past surgical history and problem list. Problem list updated.  Objective:   Filed Vitals:   06/12/15 0924  BP: 133/70  Pulse: 86  Weight: 249 lb 4.8 oz (113.082 kg)    Fetal Status: Fetal Heart Rate (bpm): NST Fundal Height: 44 cm Movement: Present     General:  Alert, oriented and cooperative. Patient is in no acute distress.  Skin: Skin is warm and dry. No rash noted.   Cardiovascular: Normal heart rate noted  Respiratory: Normal respiratory effort, no problems with respiration noted  Abdomen: Soft, gravid, appropriate for gestational age. Pain/Pressure: Present     Pelvic: Vag. Bleeding: None    Cervical exam deferred        Extremities: Normal range of motion.  Edema: Trace  Mental Status: Normal mood and affect. Normal behavior. Normal judgment and thought content.   Urinalysis: Urine Protein: Negative Urine Glucose: Negative NST performed today was reviewed and was found to be reactive.  AFI was also normal.  Continue recommended antenatal testing and prenatal care.  Presentation: Transverse Did not bring  sugar log.   Assessment and Plan:  Pregnancy: [id number] at 6258w2d  1. Gestational diabetes mellitus (GDM) controlled on oral hypoglycemic drug, antepartum 2. Polyhydramnios in third trimester, antepartum, not applicable or unspecified fetus Continue Glyburide.  EFW 81% on 05/29/15.  Desires ECV if fetus is not cephalic, does not desire cesarean delivery unless absolutely needed.  3. Supervision of high-risk pregnancy, third trimester IOL scheduled in 5 days, may need ECV prior to IOL if not cephalic.  Term labor symptoms and general obstetric precautions including but not limited to vaginal bleeding, contractions, leaking of fluid and fetal movement were reviewed in detail with the patient. Please refer to After Visit Summary for other counseling recommendations.  Return in about 6 weeks (around 07/24/2015) for PP visit.  IOL on 4/15.   Stephanie NewcomerUgonna A Adamari Frede, MD

## 2015-06-12 NOTE — Patient Instructions (Signed)
Return to clinic for any scheduled appointments or obstetric concerns, or go to MAU for evaluation  

## 2015-06-15 ENCOUNTER — Ambulatory Visit (INDEPENDENT_AMBULATORY_CARE_PROVIDER_SITE_OTHER): Payer: Medicaid Other | Admitting: *Deleted

## 2015-06-15 VITALS — BP 124/65 | HR 90

## 2015-06-15 DIAGNOSIS — O24415 Gestational diabetes mellitus in pregnancy, controlled by oral hypoglycemic drugs: Secondary | ICD-10-CM | POA: Diagnosis not present

## 2015-06-15 DIAGNOSIS — O403XX1 Polyhydramnios, third trimester, fetus 1: Secondary | ICD-10-CM

## 2015-06-15 DIAGNOSIS — O403XX0 Polyhydramnios, third trimester, not applicable or unspecified: Secondary | ICD-10-CM

## 2015-06-17 ENCOUNTER — Inpatient Hospital Stay (HOSPITAL_COMMUNITY)
Admission: AD | Admit: 2015-06-17 | Discharge: 2015-06-19 | DRG: 775 | Disposition: A | Discharge status: Home / Self Care | Payer: Medicaid Other | Source: Ambulatory Visit | Attending: Family Medicine | Admitting: Family Medicine

## 2015-06-17 ENCOUNTER — Inpatient Hospital Stay (HOSPITAL_COMMUNITY): Payer: Medicaid Other

## 2015-06-17 ENCOUNTER — Encounter (HOSPITAL_COMMUNITY): Payer: Self-pay | Admitting: *Deleted

## 2015-06-17 ENCOUNTER — Inpatient Hospital Stay (HOSPITAL_COMMUNITY): Admission: RE | Admit: 2015-06-17 | Payer: Medicaid Other | Source: Ambulatory Visit

## 2015-06-17 DIAGNOSIS — Z3689 Encounter for other specified antenatal screening: Secondary | ICD-10-CM

## 2015-06-17 DIAGNOSIS — J45909 Unspecified asthma, uncomplicated: Secondary | ICD-10-CM | POA: Diagnosis present

## 2015-06-17 DIAGNOSIS — O24425 Gestational diabetes mellitus in childbirth, controlled by oral hypoglycemic drugs: Secondary | ICD-10-CM | POA: Diagnosis present

## 2015-06-17 DIAGNOSIS — O403XX0 Polyhydramnios, third trimester, not applicable or unspecified: Principal | ICD-10-CM | POA: Diagnosis present

## 2015-06-17 DIAGNOSIS — Z8249 Family history of ischemic heart disease and other diseases of the circulatory system: Secondary | ICD-10-CM

## 2015-06-17 DIAGNOSIS — O328XX0 Maternal care for other malpresentation of fetus, not applicable or unspecified: Secondary | ICD-10-CM

## 2015-06-17 DIAGNOSIS — Z641 Problems related to multiparity: Secondary | ICD-10-CM

## 2015-06-17 DIAGNOSIS — Z8759 Personal history of other complications of pregnancy, childbirth and the puerperium: Secondary | ICD-10-CM

## 2015-06-17 DIAGNOSIS — O0933 Supervision of pregnancy with insufficient antenatal care, third trimester: Secondary | ICD-10-CM

## 2015-06-17 DIAGNOSIS — O09523 Supervision of elderly multigravida, third trimester: Secondary | ICD-10-CM

## 2015-06-17 DIAGNOSIS — O9952 Diseases of the respiratory system complicating childbirth: Secondary | ICD-10-CM | POA: Diagnosis present

## 2015-06-17 DIAGNOSIS — O24415 Gestational diabetes mellitus in pregnancy, controlled by oral hypoglycemic drugs: Secondary | ICD-10-CM

## 2015-06-17 DIAGNOSIS — O09299 Supervision of pregnancy with other poor reproductive or obstetric history, unspecified trimester: Secondary | ICD-10-CM

## 2015-06-17 DIAGNOSIS — O24410 Gestational diabetes mellitus in pregnancy, diet controlled: Secondary | ICD-10-CM

## 2015-06-17 DIAGNOSIS — O403XX1 Polyhydramnios, third trimester, fetus 1: Secondary | ICD-10-CM

## 2015-06-17 DIAGNOSIS — Z3A39 39 weeks gestation of pregnancy: Secondary | ICD-10-CM

## 2015-06-17 DIAGNOSIS — O0993 Supervision of high risk pregnancy, unspecified, third trimester: Secondary | ICD-10-CM

## 2015-06-17 LAB — TYPE AND SCREEN
ABO/RH(D): O POS
ANTIBODY SCREEN: NEGATIVE

## 2015-06-17 LAB — GLUCOSE, CAPILLARY
GLUCOSE-CAPILLARY: 95 mg/dL (ref 65–99)
Glucose-Capillary: 75 mg/dL (ref 65–99)
Glucose-Capillary: 116 mg/dL — ABNORMAL HIGH (ref 65–99)
Glucose-Capillary: 70 mg/dL (ref 65–99)

## 2015-06-17 LAB — CBC
HCT: 34.3 % — ABNORMAL LOW (ref 36.0–46.0)
Hemoglobin: 11.5 g/dL — ABNORMAL LOW (ref 12.0–15.0)
MCH: 29.9 pg (ref 26.0–34.0)
MCHC: 33.5 g/dL (ref 30.0–36.0)
MCV: 89.1 fL (ref 78.0–100.0)
PLATELETS: 147 10*3/uL — AB (ref 150–400)
RBC: 3.85 MIL/uL — AB (ref 3.87–5.11)
RDW: 16 % — ABNORMAL HIGH (ref 11.5–15.5)
WBC: 5.3 10*3/uL (ref 4.0–10.5)

## 2015-06-17 LAB — ABO/RH: ABO/RH(D): O POS

## 2015-06-17 MED ORDER — LACTATED RINGERS IV SOLN
INTRAVENOUS | Status: DC
  Administered 2015-06-17 – 2015-06-18 (×3): via INTRAVENOUS

## 2015-06-17 MED ORDER — MISOPROSTOL 25 MCG QUARTER TABLET
25.0000 ug | ORAL_TABLET | ORAL | Status: DC | PRN
Start: 2015-06-17 — End: 2015-06-18
  Administered 2015-06-17 (×2): 25 ug via VAGINAL
  Filled 2015-06-17 (×2): qty 0.25

## 2015-06-17 MED ORDER — LACTATED RINGERS IV SOLN: 500.0000 mL | INTRAVENOUS | Status: DC | PRN

## 2015-06-17 MED ORDER — LIDOCAINE HCL (PF) 1 % IJ SOLN
30.0000 mL | INTRAMUSCULAR | Status: DC | PRN
  Filled 2015-06-17: qty 30

## 2015-06-17 MED ORDER — OXYCODONE-ACETAMINOPHEN 5-325 MG PO TABS: 1.0000 | ORAL_TABLET | ORAL | Status: DC | PRN

## 2015-06-17 MED ORDER — GLYBURIDE 5 MG PO TABS
7.5000 mg | ORAL_TABLET | Freq: Every day | ORAL | Status: DC
  Filled 2015-06-17: qty 1

## 2015-06-17 MED ORDER — CITRIC ACID-SODIUM CITRATE 334-500 MG/5ML PO SOLN: 30.0000 mL | ORAL | Status: DC | PRN

## 2015-06-17 MED ORDER — OXYCODONE-ACETAMINOPHEN 5-325 MG PO TABS: 2.0000 | ORAL_TABLET | ORAL | Status: DC | PRN

## 2015-06-17 MED ORDER — OXYTOCIN 10 UNIT/ML IJ SOLN
2.5000 [IU]/h | INTRAMUSCULAR | Status: DC
  Filled 2015-06-17: qty 4

## 2015-06-17 MED ORDER — OXYTOCIN BOLUS FROM INFUSION
500.0000 mL | INTRAVENOUS | Status: DC
  Administered 2015-06-18: 500 mL via INTRAVENOUS

## 2015-06-17 MED ORDER — ACETAMINOPHEN 325 MG PO TABS: 650.0000 mg | ORAL_TABLET | ORAL | Status: DC | PRN

## 2015-06-17 MED ORDER — ONDANSETRON HCL 4 MG/2ML IJ SOLN: 4.0000 mg | Freq: Four times a day (QID) | INTRAMUSCULAR | Status: DC | PRN

## 2015-06-17 MED ORDER — TERBUTALINE SULFATE 1 MG/ML IJ SOLN: 0.2500 mg | Freq: Once | INTRAMUSCULAR | Status: DC | PRN

## 2015-06-17 NOTE — H&P (Signed)
Stephanie Wells is a 35 y.o. female [phone] at [redacted]w[redacted]d presenting for induction of labor due to polyhydramnios and A2 gestational diabetes.  Gestational diabetes with adequate control with 7.5 mg glyburide HS.  Last growth ultrasound at 36 weeks:  81%ile; vertex.    Pregnancy dated by 27 week ultrasound.  OB history significant for late prenatal care, advanced maternal age (declined screening), hx of preeclampsia, hx of postpartum hemorrhage, hx of macrosomia with a pelvis proven to 9lbs 15 oz.    History OB History    Gravida Para Term Preterm AB TAB SAB Ectopic Multiple Living   0 1 0 1 0 0 5     Past Medical History  Diagnosis Date  . Hypertension     history of pre-eclampsia  . Asthma   . Diabetes mellitus without complication (HCC)   . Vaginal Pap smear, abnormal     had colpo f/u wnl  . Gestational diabetes    Past Surgical History  Procedure Laterality Date  . Wisdom tooth extraction    . Breast cyst excision Left   . Vaginal delivery     Family History: family history includes Cancer in her maternal grandmother; Heart disease in her father; Varicose Veins in her mother. Social History:  reports that she has never smoked. She has never used smokeless tobacco. She reports that she does not drink alcohol or use illicit drugs.   Prenatal Transfer Tool  Maternal Diabetes: Yes:  Diabetes Type:  Insulin/Medication controlled Genetic Screening: Declined Maternal Ultrasounds/Referrals: Abnormal:  Findings:   Other:  Normal anatomy; polyhydramnios Fetal Ultrasounds or other Referrals:  None Maternal Substance Abuse:  No Significant Maternal Medications:  Meds include: Other: Glyburide Significant Maternal Lab Results:  Lab values include: Group B Strep negative Other Comments:  brother with cardiac condition; suspected patent ductus  Review of Systems  Constitutional: Negative for fever and chills.  Eyes: Negative for blurred vision and double vision.  Respiratory: Negative  for cough.   Gastrointestinal: Negative for abdominal pain.  Neurological: Negative for headaches.      Blood pressure 132/78, pulse 80, temperature 98.2 F (36.8 C), temperature source Oral, resp. rate 18, height  (1.651 m), weight 253 lb (114.76 kg). Maternal Exam:  Uterine Assessment: Contraction strength is mild.  Contraction frequency is rare and irregular.   Abdomen: Patient reports no abdominal tenderness. Estimated fetal weight is 9.5-10 lbs.   Fetal presentation: no presenting part  Introitus: Normal vulva. Vagina is positive for vaginal discharge (mucusy).  Cervix: Cervix evaluated by digital exam.     Fetal Exam Fetal Monitor Review: Baseline rate: 1302.  Variability: moderate (6-25 bpm).   Pattern: accelerations present.    Fetal State Assessment: Category I - tracings are normal.     Filed Vitals:   06/17/15 0808 06/17/15 0818 06/17/15 1023  BP:  132/78 131/69  Pulse:  80 66  Temp:  98.2 F (36.8 C)   TempSrc:  Oral   Resp:  18   Height:  (1.651 m)    Weight: 253 lb (114.76 kg)      Physical Exam  Constitutional: She is oriented to person, place, and time. She appears well-developed and well-nourished.  HENT:  Head: Normocephalic.  Neck: Normal range of motion. Neck supple.  Cardiovascular: Normal rate, regular rhythm and normal heart sounds.   Respiratory: Effort normal and breath sounds normal. No respiratory distress.  GI: Soft. There is no tenderness.  Genitourinary: No bleeding in the vagina.  Vaginal discharge (mucusy) found.  Musculoskeletal: Normal range of motion. She exhibits no edema.  Neurological: She is alert and oriented to person, place, and time.  Skin: Skin is warm and dry.    Dilation: 2.5 Effacement (%): 50 Cervical Position: Posterior Presentation: Undeterminable Exam by:: Margarita MailW. Karim, CNM  Prenatal labs: ABO, Rh: --/--/O POS (04/15 0845) Antibody: NEG (04/15 0845) Rubella: Immune (01/26 0000) RPR: Nonreactive  (01/26 0000)  HBsAg: Negative (01/26 0000)  HIV: Non-reactive (01/26 0000)  GBS: Negative (03/20 0000)   Results for orders placed or performed during the hospital encounter of 06/17/15 (from the past 24 hour(s))  Glucose, capillary     Status: None   Collection Time: 06/17/15  8:26 AM  Result Value Ref Range   Glucose-Capillary 95 65 - 99 mg/dL  CBC     Status: Abnormal   Collection Time: 06/17/15  8:45 AM  Result Value Ref Range   WBC 5.3 4.0 - 10.5 K/uL   RBC 3.85 (L) 3.87 - 5.11 MIL/uL   Hemoglobin 11.5 (L) 12.0 - 15.0 g/dL   HCT 96.234.3 (L) 95.236.0 - 84.146.0 %   MCV 89.1 78.0 - 100.0 fL   MCH 29.9 26.0 - 34.0 pg   MCHC 33.5 30.0 - 36.0 g/dL   RDW 32.416.0 (H) 40.111.5 - 02.715.5 %   Platelets 147 (L) 150 - 400 K/uL  Type and screen [facility]     Status: None   Collection Time: 06/17/15  8:45 AM  Result Value Ref Range   ABO/RH(D) O POS    Antibody Screen NEG    Sample Expiration 06/20/2015     Assessment/Plan: 35 y.o. [id number] at 6161w0d IUP Polyhydramnios Advanced Maternal Age GBS negative Gestational Diabetes Hx of Postpartum Hemorrhage Hx of macrosomia.    [facility] Ob [place] limited for presentation - vertex  Marlis EdelsonKARIM, WALIDAH N 06/17/2015, 12:34 PM

## 2015-06-17 NOTE — MAU Note (Signed)
Scheduled induction for polyhydramnios, gest. Diabetes, AMA.

## 2015-06-17 NOTE — Progress Notes (Signed)
Patient ID: Stephanie DinningJessica Wells, female   DOB: 05/13/1980, 35 y.o.   MRN: [id number] Doing well.  Just got another Cytotec, cervix unchanged  Filed Vitals:   06/17/15 1651 06/17/15 1705 06/17/15 1800 06/17/15 1941  BP: 125/65 130/66  119/51  Pulse: 91 80  93  Temp: 98.3 F (36.8 C)   98.2 F (36.8 C)  TempSrc: Oral   Oral  Resp: 16 16 17 16   Height:      Weight:        FHR reactive UCs every 3 min, mild, not painful to patient  Plan stop Glyburide to avoid hypoglycemia  CBG q 4 hours  EFM 20 min q hour  May walk  Will AROM when vertex lower

## 2015-06-18 ENCOUNTER — Encounter (HOSPITAL_COMMUNITY): Payer: Self-pay

## 2015-06-18 DIAGNOSIS — O24425 Gestational diabetes mellitus in childbirth, controlled by oral hypoglycemic drugs: Secondary | ICD-10-CM

## 2015-06-18 DIAGNOSIS — Z3A39 39 weeks gestation of pregnancy: Secondary | ICD-10-CM

## 2015-06-18 DIAGNOSIS — O403XX0 Polyhydramnios, third trimester, not applicable or unspecified: Secondary | ICD-10-CM

## 2015-06-18 LAB — GLUCOSE, CAPILLARY
Glucose-Capillary: 87 mg/dL (ref 65–99)
Glucose-Capillary: 105 mg/dL — ABNORMAL HIGH (ref 65–99)
Glucose-Capillary: 192 mg/dL — ABNORMAL HIGH (ref 65–99)
Glucose-Capillary: 69 mg/dL (ref 65–99)

## 2015-06-18 LAB — RPR: RPR Ser Ql: NONREACTIVE

## 2015-06-18 LAB — HIV ANTIBODY (ROUTINE TESTING W REFLEX): HIV Screen 4th Generation wRfx: NONREACTIVE

## 2015-06-18 MED ORDER — ONDANSETRON HCL 4 MG PO TABS: 4.0000 mg | ORAL_TABLET | ORAL | Status: DC | PRN

## 2015-06-18 MED ORDER — WITCH HAZEL-GLYCERIN EX PADS: 1.0000 "application " | MEDICATED_PAD | CUTANEOUS | Status: DC | PRN

## 2015-06-18 MED ORDER — PRENATAL MULTIVITAMIN CH
1.0000 | ORAL_TABLET | Freq: Every day | ORAL | Status: DC
  Administered 2015-06-19: 1 via ORAL
  Filled 2015-06-18: qty 1

## 2015-06-18 MED ORDER — IBUPROFEN 600 MG PO TABS
600.0000 mg | ORAL_TABLET | Freq: Four times a day (QID) | ORAL | Status: DC
  Administered 2015-06-18 – 2015-06-19 (×6): 600 mg via ORAL
  Filled 2015-06-18 (×6): qty 1

## 2015-06-18 MED ORDER — DIBUCAINE 1 % RE OINT
1.0000 "application " | TOPICAL_OINTMENT | RECTAL | Status: DC | PRN
  Filled 2015-06-18: qty 28

## 2015-06-18 MED ORDER — FENTANYL CITRATE (PF) 100 MCG/2ML IJ SOLN
100.0000 ug | INTRAMUSCULAR | Status: DC | PRN
  Administered 2015-06-18 (×2): 100 ug via INTRAVENOUS
  Filled 2015-06-18 (×2): qty 2

## 2015-06-18 MED ORDER — ONDANSETRON HCL 4 MG/2ML IJ SOLN: 4.0000 mg | INTRAMUSCULAR | Status: DC | PRN

## 2015-06-18 MED ORDER — TETANUS-DIPHTH-ACELL PERTUSSIS 5-2.5-18.5 LF-MCG/0.5 IM SUSP
0.5000 mL | Freq: Once | INTRAMUSCULAR | Status: DC
  Filled 2015-06-18: qty 0.5

## 2015-06-18 MED ORDER — BENZOCAINE-MENTHOL 20-0.5 % EX AERO
1.0000 "application " | INHALATION_SPRAY | CUTANEOUS | Status: DC | PRN
  Administered 2015-06-18: 1 via TOPICAL
  Filled 2015-06-18 (×2): qty 56

## 2015-06-18 MED ORDER — ZOLPIDEM TARTRATE 5 MG PO TABS: 5.0000 mg | ORAL_TABLET | Freq: Every evening | ORAL | Status: DC | PRN

## 2015-06-18 MED ORDER — ACETAMINOPHEN 325 MG PO TABS
650.0000 mg | ORAL_TABLET | ORAL | Status: DC | PRN
  Administered 2015-06-18 – 2015-06-19 (×3): 650 mg via ORAL
  Filled 2015-06-18 (×3): qty 2

## 2015-06-18 MED ORDER — COCONUT OIL OIL
1.0000 "application " | TOPICAL_OIL | Status: DC | PRN
  Filled 2015-06-18: qty 120

## 2015-06-18 MED ORDER — SENNOSIDES-DOCUSATE SODIUM 8.6-50 MG PO TABS
2.0000 | ORAL_TABLET | ORAL | Status: DC
  Filled 2015-06-18: qty 2

## 2015-06-18 MED ORDER — DIPHENHYDRAMINE HCL 25 MG PO CAPS: 25.0000 mg | ORAL_CAPSULE | Freq: Four times a day (QID) | ORAL | Status: DC | PRN

## 2015-06-18 MED ORDER — SIMETHICONE 80 MG PO CHEW: 80.0000 mg | CHEWABLE_TABLET | ORAL | Status: DC | PRN

## 2015-06-18 NOTE — Lactation Note (Addendum)
This note was copied from a baby's chart. Lactation Consultation Note Initial visit at 10 hours of age.  Baby has had a few good feedings and denies pain with latch.  Mom has extensive experience with breast feeding and youngest child for 2 years.  [facility] resources given and discussed.  Encouraged to feed with early cues on demand. Encouraged to feed 8-12x/24 hours.   Early newborn behavior discussed.  Hand expression reported by mom with colostrum visible. Encouraged mom to have nurses check latch scores. Mom does not make eye contact when talking with LC.  Encouraged mom to contact insurance to request a DEBP.  Mom to call for assist as needed.    Patient Name: Stephanie Beaulah DinningJessica Meras ZDGUY'QToday's Date: 06/18/2015 Reason for consult: Initial assessment   Maternal Data Has patient been taught Hand Expression?: Yes Does the patient have breastfeeding experience prior to this delivery?: Yes  Feeding Length of feed: 30 min  LATCH Score/Interventions                      Lactation Tools Discussed/Used WIC Program: No   Consult Status Consult Status: PRN Follow-up type: In-patient    Beverely RisenShoptaw, Arvella MerlesJana Lynn 06/18/2015, 4:44 PM

## 2015-06-18 NOTE — Progress Notes (Signed)
Patient ID: Stephanie DinningJessica Wells, female   DOB: 02/08/1981, 35 y.o.   MRN: [id number] Doing well  Filed Vitals:   06/17/15 1941 06/17/15 2204 06/17/15 2256 06/18/15 0048  BP: 119/51 120/59 124/64 112/44  Pulse: 93 77 77 74  Temp: 98.2 F (36.8 C) 98 F (36.7 C)  98.4 F (36.9 C)  TempSrc: Oral Oral  Oral  Resp: 16 18  18   Height:      Weight:       CBG (last 3)   Recent Labs  06/17/15 1354 06/17/15 1638 06/17/15 2200  GLUCAP 75 116* 70    Fetal heart rate reactive with some variable decels UCs every 2-3 minutes  Dilation: 5 Effacement (%): 60 Cervical Position: Middle Station: -3 Presentation: Vertex Exam by:: Kingsley CallanderKaren Smith RNC   Will continue to observe

## 2015-06-18 NOTE — Progress Notes (Signed)
Patient ID: Stephanie DinningJessica Wells, female   DOB: 12/31/1980, 35 y.o.   MRN: [id number] Doing well States contractions hurt more, but not ready for pain med yet  Filed Vitals:   06/17/15 2204 06/17/15 2256 06/18/15 0048 06/18/15 0154  BP: 120/59 124/64 112/44 116/47  Pulse: 77 77 74 88  Temp: 98 F (36.7 C)  98.4 F (36.9 C) 99.2 F (37.3 C)  TempSrc: Oral  Oral Oral  Resp: 18  18 16   Height:      Weight:       FHR reassuring UCs every 1.5 min  Dilation: 5 Effacement (%): 70 Cervical Position: Middle Station: Ballotable Presentation: Vertex Exam by:: Mayford KnifeWilliams CNM  Has made slow but steady progress. Membranes bulging now Unable to AROM due to high station  Will recheck in 2 hrs.  If no change, will start Pitocin

## 2015-06-18 NOTE — Progress Notes (Signed)
Patient ID: [name], female   DOB: 01/01/1981, 35 y.o.   MRN: [id number] Progressing well in active labor  Filed Vitals:   06/18/15 0427 06/18/15 0522  BP: 90/53 122/60  Pulse: 87 67  Temp: 98.8 F (37.1 C)   Resp: 18 20   CBG (last 3)   Recent Labs  06/17/15 2200 06/18/15 0207 06/18/15 0549  GLUCAP 70 87 105*     FHR reassuring  UCs every 1.895min  Dilation: 8 Effacement (%): 90 Cervical Position: Middle Station: Ballotable Presentation: Vertex Exam by:: Kingsley CallanderKaren Smith RNC

## 2015-06-19 MED ORDER — ACETAMINOPHEN 325 MG PO TABS: 650.0000 mg | ORAL_TABLET | ORAL | Status: DC | PRN

## 2015-06-19 MED ORDER — IBUPROFEN 600 MG PO TABS: 600.0000 mg | ORAL_TABLET | Freq: Four times a day (QID) | ORAL | Status: DC

## 2015-06-19 NOTE — Discharge Summary (Signed)
OB Discharge Summary     Patient Name: Stephanie DinningJessica Stoiber DOB: 06/23/1980 MRN: [id number]  Date of admission: 06/17/2015 Delivering MD: Aviva SignsWILLIAMS, MARIE L   Date of discharge: 06/19/2015  Admitting diagnosis: 39WKS INDUCTION  Intrauterine pregnancy: 3476w1d     Secondary diagnosis:  Active Problems:   Labor and delivery indication for care or intervention  Additional problems: A2GDM, polyhydramnios, asthma     Discharge diagnosis: Term Pregnancy Delivered                                                                                                Post partum procedures:none  Augmentation: Cytotec  Complications: None  Hospital course:  Induction of Labor With Vaginal Delivery   35 y.o. yo [id number] at 7476w1d was admitted to the hospital 06/17/2015 for induction of labor.  Indication for induction: A2 DM.  Patient had an uncomplicated labor course as follows: Membrane Rupture Time/Date: 6:26 AM ,06/18/2015   Intrapartum Procedures: Episiotomy: None [1]                                         Lacerations:  None [1]  Patient had delivery of a Viable infant.  Information for the patient's newborn:  Pietro Cassisvans, Girl Anquanette [[id number]][[id number]]  Delivery Method: Vaginal, Spontaneous Delivery (Filed from Delivery Summary)  Gestational diabetes: glucose wnl last check prior to discharge, not continuing glyburide at d/c, will need 2-hour gtt at 6-12 wk PP visit.    06/18/2015  Details of delivery can be found in separate delivery note.  Patient had a routine postpartum course. Patient is discharged home 06/19/2015.   Physical exam  Filed Vitals:   06/18/15 1000 06/18/15 1058 06/18/15 1330 06/19/15 0539  BP: 135/67 121/58 116/62 120/66  Pulse: 78 72 59 61  Temp:   98.5 F (36.9 C) 98.1 F (36.7 C)  TempSrc:   Oral Oral  Resp: 17 16 18 20   Height:      Weight:       See progress note dated 4/17 for day of discharge physical exam  Labs: Lab Results  Component Value Date   WBC 5.3 06/17/2015    HGB 11.5* 06/17/2015   HCT 34.3* 06/17/2015   MCV 89.1 06/17/2015   PLT 147* 06/17/2015   No flowsheet data found.  Discharge instruction: per After Visit Summary and "Baby and Me Booklet".  After visit meds:    Medication List    STOP taking these medications        ACCU-CHEK FASTCLIX LANCETS Misc     glucose blood test strip  Commonly known as:  ACCU-CHEK SMARTVIEW     glyBURIDE 5 MG tablet  Commonly known as:  DIABETA      TAKE these medications        acetaminophen 325 MG tablet  Commonly known as:  TYLENOL  Take 2 tablets (650 mg total) by mouth every 4 (four) hours as needed (for pain scale < 4).     albuterol 108 (90 Base) MCG/ACT  inhaler  Commonly known as:  PROVENTIL HFA;VENTOLIN HFA  Inhale 1 puff into the lungs every 4 (four) hours as needed for wheezing or shortness of breath.     ibuprofen 600 MG tablet  Commonly known as:  ADVIL,MOTRIN  Take 1 tablet (600 mg total) by mouth every 6 (six) hours.     Prenatal Vitamins 28-0.8 MG Tabs  Take 1 tablet by mouth.        Diet: routine diet  Activity: Advance as tolerated. Pelvic rest for 6 weeks.   Outpatient follow up:6 weeks Follow up Appt:Future Appointments Date Time Provider Department Center  07/27/2015 12:45 PM Tereso Newcomer, MD [facility]   Follow up Visit:No Follow-up on file.  Postpartum contraception: depo  Newborn Data: Live born female  Birth Weight: 7 lb 7.4 oz (3385 g) APGAR: 9, 9  Baby Feeding: Breast Disposition:home with mother   06/19/2015 Caesar Chestnut, MD   OB FELLOW DISCHARGE ATTESTATION  I have seen and examined this patient and agree with above documentation in the resident's note.   Silvano Bilis, MD 2:16 PM

## 2015-06-19 NOTE — Discharge Instructions (Signed)

## 2015-06-19 NOTE — Lactation Note (Signed)
This note was copied from a baby's chart. Lactation Consultation Note  Patient Name: Stephanie Wells WUJWJ'XToday's Date: 06/19/2015 Reason for consult: Follow-up assessment   With this mom, baby number 6, denies questions/concerns. She knows to call if  she does.   Maternal Data    Feeding    LATCH Score/Interventions                      Lactation Tools Discussed/Used     Consult Status Consult Status: Complete Follow-up type: Call as needed    Alfred LevinsLee, [name] 06/19/2015, 10:30 AM

## 2015-06-19 NOTE — Progress Notes (Signed)
POSTPARTUM PROGRESS NOTE  Post Partum Day 1 Subjective:  Stephanie Wells is a 35 y.o. [id number] 3723w1d s/p IOL for A2GDM with polyhydramnios.  No acute events overnight.  Pt denies problems with ambulating, voiding or po intake.  She denies nausea or vomiting.  Pain is well controlled with slight abdominal tenderness.  She has had flatus. She has had bowel movements.  Lochia normal.   Objective: Blood pressure 120/66, pulse 61, temperature 98.1 F (36.7 C), temperature source Oral, resp. rate 20, height 5\' 5"  (1.651 m), weight 114.76 kg (253 lb), unknown if currently breastfeeding.  Physical Exam:  General: alert, cooperative and no distress Lochia:normal flow Chest: CTAB Heart: RRR no m/r/g Abdomen: +BS, soft, mildly tender Uterine Fundus: firm, at umbilicus DVT Evaluation: No calf swelling or tenderness Extremities: bilateral pitting edema   Recent Labs  06/17/15 0845  HGB 11.5*  HCT 34.3*    Assessment/Plan:  ASSESSMENT: Stephanie Wells is a 35 y.o. [id number] 4623w1d s/p IOL for A2GDM with polyhydramnios. She is doing well and, given that this is her 6th term baby, feels ready to leave today. She plans to breast feed. She plans to use Depovera shot for contraception if it could be arranged prior to discharge.  Discharge home possible later today.      LOS: 2 days   Malka Sohomas Daubert 06/19/2015, 7:21 AM

## 2015-06-20 NOTE — Progress Notes (Signed)
NST performed today was reviewed and was found to be reactive.  IOL at 39 weeks.

## 2015-06-29 ENCOUNTER — Telehealth: Payer: Self-pay | Admitting: Obstetrics & Gynecology

## 2015-06-29 NOTE — Telephone Encounter (Signed)
Calling regarding her Fmla papers.. Stated the wrong dates was put on the form

## 2015-06-29 NOTE — Telephone Encounter (Signed)
Questions regarding her FMLA Papers... Stated the wrong dates was put on the forms

## 2015-07-03 NOTE — Telephone Encounter (Signed)
Attempted to call patient in regards to message about wrong date on fmla papers. No answer or voicemail to leave a message.

## 2015-07-17 NOTE — Telephone Encounter (Signed)
Pt has not returned call at this time. I will assume papers are fine at this point.

## 2015-07-27 ENCOUNTER — Encounter: Payer: Self-pay | Admitting: Obstetrics & Gynecology

## 2015-07-27 ENCOUNTER — Ambulatory Visit (INDEPENDENT_AMBULATORY_CARE_PROVIDER_SITE_OTHER): Payer: Medicaid Other | Admitting: Obstetrics & Gynecology

## 2015-07-27 DIAGNOSIS — Z3042 Encounter for surveillance of injectable contraceptive: Secondary | ICD-10-CM | POA: Diagnosis not present

## 2015-07-27 DIAGNOSIS — Z8632 Personal history of gestational diabetes: Secondary | ICD-10-CM | POA: Insufficient documentation

## 2015-07-27 DIAGNOSIS — Z3202 Encounter for pregnancy test, result negative: Secondary | ICD-10-CM

## 2015-07-27 LAB — POCT PREGNANCY, URINE: Preg Test, Ur: NEGATIVE

## 2015-07-27 MED ORDER — MEDROXYPROGESTERONE ACETATE 150 MG/ML IM SUSP
150.0000 mg | INTRAMUSCULAR | Status: DC
  Administered 2015-07-27 – 2019-05-18 (×12): 150 mg via INTRAMUSCULAR

## 2015-07-27 NOTE — Progress Notes (Signed)
Patient ID: Stephanie DinningJessica Leppanen, female   DOB: 03/25/1980, 35 y.o.   MRN: [id number] Subjective:     Stephanie Wells is a 35 y.o. [phone] female who presents for a postpartum visit. She is six weeks postpartum following a vaginal delivery on 06/18/2015. I have fully reviewed the prenatal and intrapartum course; patient had A2GDM.  Of note, had GDM with 4 pregnancies, never been tested outside pregnancy. The delivery was at  39 gestational weeks. Anesthesia: IV medications . Postpartum course has been uncomplicated. Baby's course has been completed. Baby is feeding by breast. Bleeding normal. Bowel function is normal. Bladder function is normal. Patient is not sexually active. Contraception method is none. Postpartum depression screening:negative   The following portions of the patient's history were reviewed and updated as appropriate: allergies, current medications, past family history, past medical history, past social history, past surgical history and problem list. [facility]  Review of Systems Pertinent items noted in HPI and remainder of comprehensive ROS otherwise negative.   Objective:    BP 111/52 mmHg  Pulse 77  Wt 212 lb (96.163 kg)  General:  alert and no distress   Breasts:  inspection negative, no nipple discharge or bleeding, no masses or nodularity palpable  Lungs: clear to auscultation bilaterally  Heart:  regular rate and rhythm  Abdomen: soft, non-tender; bowel sounds normal; no masses,  no organomegaly  Pelvic:  not evaluated        Assessment:   Normal postpartum exam.  Plan:   1. Contraception: Depo-Provera injection today. RTC in 3 months for next shot. 2. Follow up for 2 hr GTT given h/o GDM and high likelihood of Type II DM   Jaynie CollinsUGONNA  Kayle Correa, MD, FACOG Attending Obstetrician & Gynecologist, [facility]

## 2015-07-27 NOTE — Patient Instructions (Signed)
Return to clinic for any scheduled appointments or for any gynecologic concerns as needed.   

## 2015-08-26 ENCOUNTER — Encounter: Payer: Self-pay | Admitting: *Deleted

## 2015-08-26 NOTE — Progress Notes (Signed)
F[facility]  Will have someone contact patient to come by and pick up her copy.

## 2015-10-19 ENCOUNTER — Ambulatory Visit: Payer: Medicaid Other

## 2015-11-15 ENCOUNTER — Ambulatory Visit (INDEPENDENT_AMBULATORY_CARE_PROVIDER_SITE_OTHER): Payer: Self-pay | Admitting: *Deleted

## 2015-11-15 VITALS — BP 117/49 | HR 67

## 2015-11-15 DIAGNOSIS — Z3202 Encounter for pregnancy test, result negative: Secondary | ICD-10-CM

## 2015-11-15 DIAGNOSIS — Z3042 Encounter for surveillance of injectable contraceptive: Secondary | ICD-10-CM

## 2015-11-15 LAB — POCT PREGNANCY, URINE: PREG TEST UR: NEGATIVE

## 2015-11-15 MED ORDER — MEDROXYPROGESTERONE ACETATE 150 MG/ML IM SUSP
150.0000 mg | Freq: Once | INTRAMUSCULAR | Status: AC
  Administered 2015-11-15: 150 mg via INTRAMUSCULAR

## 2015-11-15 NOTE — Progress Notes (Addendum)
Here for depoprovera shot. 16 weeks since last injection. States last intercourse 3 weeks ago, urine pregnancy test today negative.   Also instructed to use back up method like condoms for 2 weeks until depo- provera effective. Patient voices understanding. Discussed importance of getting shots on time.

## 2016-01-31 ENCOUNTER — Other Ambulatory Visit: Payer: Self-pay

## 2016-01-31 ENCOUNTER — Ambulatory Visit (INDEPENDENT_AMBULATORY_CARE_PROVIDER_SITE_OTHER): Payer: BLUE CROSS/BLUE SHIELD

## 2016-01-31 VITALS — BP 115/86 | HR 82

## 2016-01-31 DIAGNOSIS — Z3042 Encounter for surveillance of injectable contraceptive: Secondary | ICD-10-CM

## 2016-01-31 MED ORDER — MEDROXYPROGESTERONE ACETATE 150 MG/ML IM SUSP
150.0000 mg | Freq: Once | INTRAMUSCULAR | Status: AC
  Administered 2016-01-31: 150 mg via INTRAMUSCULAR

## 2016-01-31 MED ORDER — ALBUTEROL SULFATE HFA 108 (90 BASE) MCG/ACT IN AERS: 1.0000 | INHALATION_SPRAY | RESPIRATORY_TRACT | 0 refills | Status: DC | PRN

## 2016-01-31 NOTE — Progress Notes (Signed)
Patient presented to office today for her depo-provera injection. Patient tolerated well and will follow up in three months. 

## 2016-01-31 NOTE — Telephone Encounter (Signed)
Patient would like to have a inhaler called into her pharmacy. Per Christie BeckersJennifer R. Patient can have one refill. I have offered to assist patient in finding a PCP.

## 2016-04-17 ENCOUNTER — Ambulatory Visit: Payer: Medicaid Other

## 2016-04-24 ENCOUNTER — Ambulatory Visit (INDEPENDENT_AMBULATORY_CARE_PROVIDER_SITE_OTHER): Payer: BLUE CROSS/BLUE SHIELD | Admitting: *Deleted

## 2016-04-24 VITALS — Wt 204.7 lb

## 2016-04-24 DIAGNOSIS — Z3042 Encounter for surveillance of injectable contraceptive: Secondary | ICD-10-CM

## 2016-04-24 NOTE — Progress Notes (Signed)
Depo Provera injection administered as scheduled.  Next due 5/9-5/23.

## 2016-07-24 ENCOUNTER — Ambulatory Visit (INDEPENDENT_AMBULATORY_CARE_PROVIDER_SITE_OTHER): Payer: BLUE CROSS/BLUE SHIELD | Admitting: Advanced Practice Midwife

## 2016-07-24 ENCOUNTER — Other Ambulatory Visit (HOSPITAL_COMMUNITY)
Admission: RE | Admit: 2016-07-24 | Discharge: 2016-07-24 | Disposition: A | Discharge status: Home / Self Care | Payer: BLUE CROSS/BLUE SHIELD | Source: Ambulatory Visit | Attending: Advanced Practice Midwife | Admitting: Advanced Practice Midwife

## 2016-07-24 VITALS — BP 116/84 | HR 72 | Ht 65.0 in | Wt 202.1 lb

## 2016-07-24 DIAGNOSIS — Z124 Encounter for screening for malignant neoplasm of cervix: Secondary | ICD-10-CM

## 2016-07-24 DIAGNOSIS — Z113 Encounter for screening for infections with a predominantly sexual mode of transmission: Secondary | ICD-10-CM

## 2016-07-24 DIAGNOSIS — Z01419 Encounter for gynecological examination (general) (routine) without abnormal findings: Secondary | ICD-10-CM

## 2016-07-24 DIAGNOSIS — F525 Vaginismus not due to a substance or known physiological condition: Secondary | ICD-10-CM

## 2016-07-24 DIAGNOSIS — Z3042 Encounter for surveillance of injectable contraceptive: Secondary | ICD-10-CM | POA: Diagnosis not present

## 2016-07-24 DIAGNOSIS — Z1151 Encounter for screening for human papillomavirus (HPV): Secondary | ICD-10-CM

## 2016-07-24 MED ORDER — MEDROXYPROGESTERONE ACETATE 150 MG/ML IM SUSP
150.0000 mg | Freq: Once | INTRAMUSCULAR | Status: AC
  Administered 2016-07-24: 150 mg via INTRAMUSCULAR

## 2016-07-24 NOTE — Progress Notes (Signed)
Subjective:     Stephanie Wells is a 36 y.o. female here for a routine exam.  Current complaints: dyspareunia. Patient is currently sexually active with monogamous partner; we discussed contraception, she is currently on Depo and tolerating it well. Last pap smear was approximately 1 year ago; patient has agreed to have pap with HPV co-testing today. Patient requested a breast exam, and agrees to STI screening given pain with intercourse.    Gynecologic History No LMP recorded. Patient has had an injection.  Patient says LMP was several months ago. Contraception: Depo-Provera injections Last Pap: January 2017. Results were: normal Last mammogram: N/A. Results were: N/A Family history negative for breast, cervical, uterine, or ovarian cancer.   Obstetric History OB History  Gravida Para Term Preterm AB Living  7 6 6  0 1 6  SAB TAB Ectopic Multiple Live Births  1 0 0 0 6    # Outcome Date GA Lbr Len/2nd Weight Sex Delivery Anes PTL Lv  7 Term 06/18/15 76104w1d 05:45 / 00:12 7 lb 7.4 oz (3.385 kg) F Vag-Spont None  LIV  6 Term 06/18/12 6768w0d  6 lb 7 oz (2.92 kg) F Vag-Spont  N LIV     Complications: GDM (gestational diabetes mellitus)  5 Term 03/13/09 5110w0d  9 lb 15 oz (4.508 kg) F Vag-Spont  N LIV     Complications: IDDM (insulin dependent diabetes mellitus) (HCC)  4 SAB 2010          3 Term 03/13/06 6758w0d  7 lb 6 oz (3.345 kg) M Vag-Spont  N LIV     Complications: Pre-eclampsia  2 Term 10/22/04 8470w0d  9 lb 13 oz (4.451 kg) M Vag-Spont  N LIV     Complications: GDM (gestational diabetes mellitus)  1 Term 05/29/85 4238w0d  8 lb 6 oz (3.799 kg) F Vag-Spont  N LIV     Complications: GDM (gestational diabetes mellitus),Pre-eclampsia       The following portions of the patient's history were reviewed and updated as appropriate: allergies and problem list.  Review of Systems A comprehensive review of systems was negative except for: Integument/breast: positive for boil in right armpit;  currently non-tender     Objective:    BP 116/84   Pulse 72   Ht 5\' 5"  (1.651 m)   Wt 202 lb 1.6 oz (91.7 kg)   BMI 33.63 kg/m   General Appearance:    Alert, cooperative, no distress, appears stated age  Head:    Normocephalic, without obvious abnormality, atraumatic  Eyes:    PERRL, conjunctiva/corneas clear, EOM's intact, fundi    benign, both eyes  Throat:   Lips, mucosa, and tongue normal; teeth and gums normal  Neck:   Supple, symmetrical, trachea midline, no adenopathy;    thyroid:  no enlargement/tenderness/nodules; no carotid   bruit or JVD  Lungs:     Clear to auscultation bilaterally, respirations unlabored   Heart:    Regular rate and rhythm, S1 and S2 normal, no murmur, rub   or gallop  Breast Exam:    No tenderness, masses, or nipple abnormality  Abdomen:     Soft, non-tender, bowel sounds active all four quadrants,    no masses, no organomegaly  Genitalia:    Normal female without lesion, discharge or tenderness  Extremities:   Extremities normal, atraumatic, no cyanosis or edema  Pulses:   2+ and symmetric all extremities  Skin:   Skin color, texture, turgor normal, no rashes or lesions  Lymph  nodes:   Cervical, supraclavicular, and axillary nodes normal        Assessment:    Healthy female exam.  Patient is currently sexually active with monogamous partner (no recent changes), and notes new onset of dyspareunia over the past few months. Discussed possibility of returning for clinic visit to explore this further, to which she was amenable. Patient has agreed to have pap with HPV co-testing today as well as STI screen, given pain with intercourse. Patient also requested a breast exam.   Plan:   Contraceptive: depo-provera shot (received today)  Pap smear with HPV co-testing  STI screen  Follow up in a year for annual gyn exam Pending pap results, if normal repeat in 5 years  OB fellow attestation:  I have seen and examined this patient; I agree with above  documentation in the Medical Student's note.   Stephanie Wells is a 21 y.o. [phone] reporting pain and difficulty relaxing w/ IC since giving birth one year ago. No complications w/ that birth. Is in a long-term, mutually monogamous relationship w/ no relationship problems or reported abuse.  Denies VB, vaginal discharge, odor. Pain is vulvar, not pelvic.   PE: BP 116/84   Pulse 72   Ht 5\' 5"  (1.651 m)   Wt 202 lb 1.6 oz (91.7 kg)   BMI 33.63 kg/m  Gen: calm comfortable, NAD Resp: normal effort, no distress Abd: soft, NT Pelvic: NEFG. No tensing w/ pelvic exam. Nml vagina, cervix. No CMT or adnexal tenderness or masses.    Plan: 1. Well woman exam  - medroxyPROGESTERone (DEPO-PROVERA) injection 150 mg; Inject 1 mL (150 mg total) into the muscle once.   2. Papanicolaou smear for cervical cancer screening - Cytology - PAP, GC.Chlamydia  3. 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  - F/U visit w/ Dr. Marice Potter PRN if no improvement.  Stephanie Wells, CNM 10:35 AM

## 2016-07-24 NOTE — Patient Instructions (Addendum)
Breast Self-Awareness Breast self-awareness means being familiar with how your breasts look and feel. It involves checking your breasts regularly and reporting any changes to your health care provider. Practicing breast self-awareness is important. A change in your breasts can be a sign of a serious medical problem. Being familiar with how your breasts look and feel allows you to find any problems early, when treatment is more likely to be successful. All women should practice breast self-awareness, including women who have had breast implants. How to do a breast self-exam One way to learn what is normal for your breasts and whether your breasts are changing is to do a breast self-exam. To do a breast self-exam: Look for Changes   1. Remove all the clothing above your waist. 2. Stand in front of a mirror in a room with good lighting. 3. Put your hands on your hips. 4. Push your hands firmly downward. 5. Compare your breasts in the mirror. Look for differences between them (asymmetry), such as:  Differences in shape.  Differences in size.  Puckers, dips, and bumps in one breast and not the other. 6. Look at each breast for changes in your skin, such as:  Redness.  Scaly areas. 7. Look for changes in your nipples, such as:  Discharge.  Bleeding.  Dimpling.  Redness.  A change in position. Feel for Changes   Carefully feel your breasts for lumps and changes. It is best to do this while lying on your back on the floor and again while sitting or standing in the shower or tub with soapy water on your skin. Feel each breast in the following way:  Place the arm on the side of the breast you are examining above your head.  Feel your breast with the other hand.  Start in the nipple area and make  inch (2 cm) overlapping circles to feel your breast. Use the pads of your three middle fingers to do this. Apply light pressure, then medium pressure, then firm pressure. The light pressure  will allow you to feel the tissue closest to the skin. The medium pressure will allow you to feel the tissue that is a little deeper. The firm pressure will allow you to feel the tissue close to the ribs.  Continue the overlapping circles, moving downward over the breast until you feel your ribs below your breast.  Move one finger-width toward the center of the body. Continue to use the  inch (2 cm) overlapping circles to feel your breast as you move slowly up toward your collarbone.  Continue the up and down exam using all three pressures until you reach your armpit. Write Down What You Find   Write down what is normal for each breast and any changes that you find. Keep a written record with breast changes or normal findings for each breast. By writing this information down, you do not need to depend only on memory for size, tenderness, or location. Write down where you are in your menstrual cycle, if you are still menstruating. If you are having trouble noticing differences in your breasts, do not get discouraged. With time you will become more familiar with the variations in your breasts and more comfortable with the exam. How often should I examine my breasts? Examine your breasts every month. If you are breastfeeding, the best time to examine your breasts is after a feeding or after using a breast pump. If you menstruate, the best time to examine your breasts is 5-7 days   after your period is over. During your period, your breasts are lumpier, and it may be more difficult to notice changes. When should I see my health care provider? See your health care provider if you notice:  A change in shape or size of your breasts or nipples.  A change in the skin of your breast or nipples, such as a reddened or scaly area.  Unusual discharge from your nipples.  A lump or thick area that was not there before.  Pain in your breasts.  Anything that concerns you. This information is not intended to  replace advice given to you by your health care provider. Make sure you discuss any questions you have with your health care provider. Document Released: 02/18/2005 Document Revised: 07/27/2015 Document Reviewed: 01/08/2015 Elsevier Interactive Patient Education  2017 Elsevier Inc.  

## 2016-07-26 LAB — CYTOLOGY - PAP
Chlamydia: NEGATIVE
DIAGNOSIS: NEGATIVE
HPV (WINDOPATH): NOT DETECTED
NEISSERIA GONORRHEA: NEGATIVE

## 2016-10-16 ENCOUNTER — Ambulatory Visit (INDEPENDENT_AMBULATORY_CARE_PROVIDER_SITE_OTHER): Payer: BLUE CROSS/BLUE SHIELD

## 2016-10-16 VITALS — BP 113/57 | HR 64 | Wt 206.7 lb

## 2016-10-16 DIAGNOSIS — Z30019 Encounter for initial prescription of contraceptives, unspecified: Secondary | ICD-10-CM

## 2016-10-16 DIAGNOSIS — Z3042 Encounter for surveillance of injectable contraceptive: Secondary | ICD-10-CM

## 2016-10-16 NOTE — Progress Notes (Signed)
Patient presented to the office for Depo injection. Patient tolerated well.  

## 2017-01-01 ENCOUNTER — Ambulatory Visit: Payer: BLUE CROSS/BLUE SHIELD

## 2017-01-02 ENCOUNTER — Ambulatory Visit (INDEPENDENT_AMBULATORY_CARE_PROVIDER_SITE_OTHER): Payer: BLUE CROSS/BLUE SHIELD

## 2017-01-02 VITALS — BP 126/80 | HR 77 | Wt 211.0 lb

## 2017-01-02 DIAGNOSIS — Z309 Encounter for contraceptive management, unspecified: Secondary | ICD-10-CM | POA: Diagnosis not present

## 2017-01-02 DIAGNOSIS — Z3042 Encounter for surveillance of injectable contraceptive: Secondary | ICD-10-CM

## 2017-01-02 NOTE — Progress Notes (Signed)
Chart reviewed for nurse visit. Agree with plan of care.   Marylene LandKooistra, Kathryn Lorraine, CNM 01/02/2017 5:08 PM

## 2017-01-02 NOTE — Progress Notes (Signed)
Pt here today for Depo Provera 150 mg.  Tolerated well. Pt denies any c/o at this time.  Next Depo Provera scheduled for Jan 17- 31.

## 2017-03-25 ENCOUNTER — Ambulatory Visit (INDEPENDENT_AMBULATORY_CARE_PROVIDER_SITE_OTHER): Payer: BLUE CROSS/BLUE SHIELD | Admitting: *Deleted

## 2017-03-25 VITALS — BP 120/73 | HR 83

## 2017-03-25 DIAGNOSIS — Z3042 Encounter for surveillance of injectable contraceptive: Secondary | ICD-10-CM

## 2017-03-25 NOTE — Progress Notes (Signed)
Chart reviewed for nurse visit. Agree with plan of care.   Judeth HornLawrence, Kimberly Nieland, NP 03/25/2017 12:26 PM

## 2017-06-10 ENCOUNTER — Ambulatory Visit (INDEPENDENT_AMBULATORY_CARE_PROVIDER_SITE_OTHER): Payer: BLUE CROSS/BLUE SHIELD | Admitting: *Deleted

## 2017-06-10 VITALS — BP 116/78 | HR 68

## 2017-06-10 DIAGNOSIS — Z3042 Encounter for surveillance of injectable contraceptive: Secondary | ICD-10-CM

## 2017-06-10 NOTE — Progress Notes (Signed)
Stephanie DinningJessica Wells here for Depo-Provera  Injection.  Injection administered without complication. Patient will return in 3 months for next injection and annual exam. Pt aware she will need the exam to continue depo provera.  Garret ReddishBarnes, Stephanie Trudel M, RN 06/10/2017  10:00 AM

## 2017-06-12 NOTE — Progress Notes (Signed)
I have reviewed the chart and agree with nursing staff's documentation of this patient's encounter.  Catalina AntiguaPeggy Kristan Votta, MD 06/12/2017 4:14 PM

## 2017-08-26 ENCOUNTER — Other Ambulatory Visit (HOSPITAL_COMMUNITY)
Admission: RE | Admit: 2017-08-26 | Discharge: 2017-08-26 | Disposition: A | Discharge status: Home / Self Care | Payer: BLUE CROSS/BLUE SHIELD | Source: Ambulatory Visit | Attending: Obstetrics and Gynecology | Admitting: Obstetrics and Gynecology

## 2017-08-26 ENCOUNTER — Encounter: Payer: Self-pay | Admitting: Obstetrics and Gynecology

## 2017-08-26 ENCOUNTER — Ambulatory Visit (INDEPENDENT_AMBULATORY_CARE_PROVIDER_SITE_OTHER): Payer: BLUE CROSS/BLUE SHIELD | Admitting: Obstetrics and Gynecology

## 2017-08-26 VITALS — BP 124/64 | HR 70 | Resp 16 | Wt 218.5 lb

## 2017-08-26 DIAGNOSIS — Z01419 Encounter for gynecological examination (general) (routine) without abnormal findings: Secondary | ICD-10-CM | POA: Insufficient documentation

## 2017-08-26 DIAGNOSIS — Z3042 Encounter for surveillance of injectable contraceptive: Secondary | ICD-10-CM

## 2017-08-26 MED ORDER — MEDROXYPROGESTERONE ACETATE 150 MG/ML IM SUSP
150.0000 mg | Freq: Once | INTRAMUSCULAR | Status: AC
  Administered 2017-08-26: 150 mg via INTRAMUSCULAR

## 2017-08-26 NOTE — Progress Notes (Signed)
GYNECOLOGY CLINIC ANNUAL PREVENTATIVE CARE ENCOUNTER NOTE  Subjective:   Stephanie Wells is a 37 y.o. [phone] female here for a routine annual gynecologic exam.  She has no complaints.   Denies abnormal vaginal bleeding, discharge, pelvic pain, problems with intercourse or other gynecologic concerns.    Gynecologic History No LMP recorded. Patient has had an injection. Contraception: Depo-Provera injections Last Pap: 2018 without HPV co-testing. Results were: normal Last mammogram: n/a. Results were: n/a  Obstetric History OB History  Gravida Para Term Preterm AB Living  7 6 6  0 1 6  SAB TAB Ectopic Multiple Live Births  1 0 0 0 6    # Outcome Date GA Lbr Len/2nd Weight Sex Delivery Anes PTL Lv  7 Term 06/18/15 110w1d 05:45 / 00:12 7 lb 7.4 oz (3.385 kg) F Vag-Spont None  LIV  6 Term 06/18/12 [redacted]w[redacted]d  6 lb 7 oz (2.92 kg) F Vag-Spont  N LIV     Complications: GDM (gestational diabetes mellitus)  5 Term 03/13/09 [redacted]w[redacted]d  9 lb 15 oz (4.508 kg) F Vag-Spont  N LIV     Complications: IDDM (insulin dependent diabetes mellitus) (HCC)  4 SAB 2010          3 Term 03/13/06 [redacted]w[redacted]d  7 lb 6 oz (3.345 kg) M Vag-Spont  N LIV     Complications: Pre-eclampsia  2 Term 10/22/04 [redacted]w[redacted]d  9 lb 13 oz (4.451 kg) M Vag-Spont  N LIV     Complications: GDM (gestational diabetes mellitus)  1 Term 05/29/85 [redacted]w[redacted]d  8 lb 6 oz (3.799 kg) F Vag-Spont  N LIV     Complications: GDM (gestational diabetes mellitus), Pre-eclampsia    Past Medical History:  Diagnosis Date  . Asthma   . Diabetes mellitus without complication (HCC)   . Gestational diabetes   . Hypertension    history of pre-eclampsia  . Vaginal Pap smear, abnormal    had colpo f/u wnl    Past Surgical History:  Procedure Laterality Date  . BREAST CYST EXCISION Left   . VAGINAL DELIVERY    . WISDOM TOOTH EXTRACTION      Current Outpatient Medications on File Prior to Visit  Medication Sig Dispense Refill  . acetaminophen (TYLENOL) 325 MG tablet  Take 2 tablets (650 mg total) by mouth every 4 (four) hours as needed (for pain scale < 4). 60 tablet 3  . albuterol (PROVENTIL HFA;VENTOLIN HFA) 108 (90 Base) MCG/ACT inhaler Inhale 1 puff into the lungs every 4 (four) hours as needed for wheezing or shortness of breath. 1 Inhaler 0  . ibuprofen (ADVIL,MOTRIN) 600 MG tablet Take 1 tablet (600 mg total) by mouth every 6 (six) hours. 30 tablet 0  . naproxen (NAPROSYN) 250 MG tablet Take by mouth 2 (two) times daily with a meal.    . Prenatal Vit-Fe Fumarate-FA (PRENATAL VITAMINS) 28-0.8 MG TABS Take 1 tablet by mouth.      Current Facility-Administered Medications on File Prior to Visit  Medication Dose Route Frequency Provider Last Rate Last Dose  . medroxyPROGESTERone (DEPO-PROVERA) injection 150 mg  150 mg Intramuscular Q90 days Anyanwu, Jethro Bastos, MD   150 mg at 06/10/17 0959    No Known Allergies  Social History   Socioeconomic History  . Marital status: Married    Spouse name: Not on file  . Number of children: Not on file  . Years of education: Not on file  . Highest education level: Not on file  Occupational History  .  Not on file  Social Needs  . Financial resource strain: Not on file  . Food insecurity:    Worry: Not on file    Inability: Not on file  . Transportation needs:    Medical: Not on file    Non-medical: Not on file  Tobacco Use  . Smoking status: Never Smoker  . Smokeless tobacco: Never Used  Substance and Sexual Activity  . Alcohol use: No  . Drug use: No  . Sexual activity: Yes    Birth control/protection: None  Lifestyle  . Physical activity:    Days per week: Not on file    Minutes per session: Not on file  . Stress: Not on file  Relationships  . Social connections:    Talks on phone: Not on file    Gets together: Not on file    Attends religious service: Not on file    Active member of club or organization: Not on file    Attends meetings of clubs or organizations: Not on file    Relationship  status: Not on file  . Intimate partner violence:    Fear of current or ex partner: Not on file    Emotionally abused: Not on file    Physically abused: Not on file    Forced sexual activity: Not on file  Other Topics Concern  . Not on file  Social History Narrative  . Not on file    Family History  Problem Relation Age of Onset  . Varicose Veins Mother   . Heart disease Father   . Cancer Maternal Grandmother        lung cancer    The following portions of the patient's history were reviewed and updated as appropriate: allergies, current medications, past family history, past medical history, past social history, past surgical history and problem list.  Review of Systems Constitutional: negative Eyes: negative Ears, nose, mouth, throat, and face: negative Respiratory: negative Cardiovascular: negative Gastrointestinal: negative Genitourinary:negative Integument/breast: negative Hematologic/lymphatic: negative Musculoskeletal:negative Neurological: negative Behavioral/Psych: negative Endocrine: negative Allergic/Immunologic: negative   Objective:  BP 124/64 (BP Location: Right Arm, Patient Position: Sitting, Cuff Size: Large)   Pulse 70   Resp 16   Wt 218 lb 8 oz (99.1 kg)   Breastfeeding? No   BMI 36.36 kg/m  CONSTITUTIONAL: Well-developed, well-nourished female in no acute distress.  HENT:  Normocephalic, atraumatic, External right and left ear normal. Oropharynx is clear and moist EYES: Conjunctivae and EOM are normal. Pupils are equal, round, and reactive to light. No scleral icterus.  NECK: Normal range of motion, supple, no masses.  Normal thyroid.  SKIN: Skin is warm and dry. No rash noted. Not diaphoretic. No erythema. No pallor. NEUROLGIC: Alert and oriented to person, place, and time. Normal reflexes, muscle tone coordination. No cranial nerve deficit noted. PSYCHIATRIC: Normal mood and affect. Normal behavior. Normal judgment and thought  content. CARDIOVASCULAR: Normal heart rate noted, regular rhythm RESPIRATORY: Clear to auscultation bilaterally. Effort and breath sounds normal, no problems with respiration noted. BREASTS: Symmetric in size. No masses, skin changes, nipple drainage, or lymphadenopathy. Some scarring on LT breast from previous biopsy/procedure. ABDOMEN: Soft, normal bowel sounds, no distention noted.  No tenderness, rebound or guarding.  PELVIC: Normal appearing external genitalia; normal appearing vaginal mucosa and cervix.  No abnormal discharge noted.  Pap smear obtained.  Normal uterine size, no other palpable masses, no uterine or adnexal tenderness. MUSCULOSKELETAL: Normal range of motion. No tenderness.  No cyanosis, clubbing, or edema.  2+ distal pulses.   Assessment:  Annual gynecologic examination with pap smear and co-testing   Plan:   Will follow up results of pap smear and manage accordingly. Routine preventative health maintenance measures emphasized. Given contact information [facility] for primary care needs. Please refer to After Visit Summary for other counseling recommendations.  RTO in 1 yr for annual well-woman exam.  50% of 20 min annual exam of new patient was spent in counseling and coordination of care  Raelyn MoraRolitta Rowen Hur, CNM  08/26/2017 11:03 AM

## 2017-08-26 NOTE — Patient Instructions (Signed)
Please call [facility] for primary care needs  Address: [address], [place], [place] [address]                         [phone]

## 2017-08-28 LAB — CYTOLOGY - PAP
Diagnosis: NEGATIVE
HPV (WINDOPATH): NOT DETECTED

## 2017-11-12 ENCOUNTER — Ambulatory Visit (INDEPENDENT_AMBULATORY_CARE_PROVIDER_SITE_OTHER): Payer: BLUE CROSS/BLUE SHIELD | Admitting: General Practice

## 2017-11-12 VITALS — BP 127/73 | HR 77 | Ht 65.0 in | Wt 229.0 lb

## 2017-11-12 DIAGNOSIS — Z3042 Encounter for surveillance of injectable contraceptive: Secondary | ICD-10-CM | POA: Diagnosis not present

## 2017-11-12 NOTE — Progress Notes (Signed)
Chart reviewed for nurse visit. Agree with plan of care.   Judeth Horn, NP 11/12/2017 10:35 AM

## 2017-11-12 NOTE — Progress Notes (Signed)
Stephanie Wells here for Depo-Provera  Injection.  Injection administered without complication. Patient will return in 3 months for next injection.  Marylynn Pearson, RN 11/12/2017  9:59 AM

## 2018-01-28 ENCOUNTER — Ambulatory Visit (INDEPENDENT_AMBULATORY_CARE_PROVIDER_SITE_OTHER): Payer: BLUE CROSS/BLUE SHIELD | Admitting: *Deleted

## 2018-01-28 VITALS — BP 118/81 | HR 65

## 2018-01-28 DIAGNOSIS — Z3042 Encounter for surveillance of injectable contraceptive: Secondary | ICD-10-CM

## 2018-01-28 NOTE — Progress Notes (Signed)
Stephanie DinningJessica Bossi here for Depo-Provera  Injection.  Injection administered without complication. Patient will return in 3 months for next injection.  Eulah PontLinda Z,RN 01/28/2018  10:16 AM

## 2018-04-22 ENCOUNTER — Ambulatory Visit (INDEPENDENT_AMBULATORY_CARE_PROVIDER_SITE_OTHER): Payer: BLUE CROSS/BLUE SHIELD | Admitting: General Practice

## 2018-04-22 VITALS — BP 130/71 | HR 69 | Ht 66.0 in | Wt 230.0 lb

## 2018-04-22 DIAGNOSIS — Z3042 Encounter for surveillance of injectable contraceptive: Secondary | ICD-10-CM

## 2018-04-22 NOTE — Progress Notes (Signed)
I have reviewed the chart and agree with nursing staff's documentation of this patient's encounter.  Jaynie Collins, MD 04/22/2018 10:04 AM

## 2018-04-22 NOTE — Progress Notes (Signed)
[name] here for Depo-Provera  Injection.  Injection administered without complication. Patient will return in 3 months for next injection.  Marylynn Pearson, RN 04/22/2018  9:42 AM

## 2018-07-14 ENCOUNTER — Ambulatory Visit (INDEPENDENT_AMBULATORY_CARE_PROVIDER_SITE_OTHER): Payer: BLUE CROSS/BLUE SHIELD | Admitting: General Practice

## 2018-07-14 ENCOUNTER — Other Ambulatory Visit: Payer: Self-pay

## 2018-07-14 VITALS — BP 117/87 | HR 71 | Ht 66.0 in | Wt 224.0 lb

## 2018-07-14 DIAGNOSIS — Z3042 Encounter for surveillance of injectable contraceptive: Secondary | ICD-10-CM | POA: Diagnosis not present

## 2018-07-14 MED ORDER — MEDROXYPROGESTERONE ACETATE 150 MG/ML IM SUSP
150.0000 mg | Freq: Once | INTRAMUSCULAR | Status: AC
  Administered 2018-07-14: 150 mg via INTRAMUSCULAR

## 2018-07-14 NOTE — Progress Notes (Signed)
Chart reviewed for nurse visit. Agree with plan of care.   Rolm Bookbinder, [place] 07/14/2018 9:57 AM

## 2018-07-14 NOTE — Progress Notes (Signed)
Stephanie Wells here for Depo-Provera  Injection.  Injection administered without complication. Patient will return in 3 months for next injection.  Marylynn Pearson, RN 07/14/2018  9:19 AM

## 2018-09-29 ENCOUNTER — Ambulatory Visit (INDEPENDENT_AMBULATORY_CARE_PROVIDER_SITE_OTHER): Payer: BC Managed Care – PPO | Admitting: General Practice

## 2018-09-29 ENCOUNTER — Other Ambulatory Visit: Payer: Self-pay

## 2018-09-29 VITALS — BP 110/81 | HR 84 | Ht 66.0 in | Wt 231.0 lb

## 2018-09-29 DIAGNOSIS — Z3042 Encounter for surveillance of injectable contraceptive: Secondary | ICD-10-CM | POA: Diagnosis not present

## 2018-09-29 NOTE — Progress Notes (Signed)
Eduardo Osier here for Depo-Provera  Injection.  Injection administered without complication. Patient will return in 3 months for next injection.  Derinda Late, RN 09/29/2018  9:23 AM

## 2018-12-15 ENCOUNTER — Ambulatory Visit (INDEPENDENT_AMBULATORY_CARE_PROVIDER_SITE_OTHER): Payer: BC Managed Care – PPO | Admitting: Emergency Medicine

## 2018-12-15 ENCOUNTER — Other Ambulatory Visit: Payer: Self-pay

## 2018-12-15 DIAGNOSIS — Z3042 Encounter for surveillance of injectable contraceptive: Secondary | ICD-10-CM

## 2018-12-15 LAB — POCT PREGNANCY, URINE: Preg Test, Ur: NEGATIVE

## 2018-12-15 MED ORDER — MEDROXYPROGESTERONE ACETATE 150 MG/ML IM SUSP
150.0000 mg | Freq: Once | INTRAMUSCULAR | Status: AC
  Administered 2018-12-15: 150 mg via INTRAMUSCULAR

## 2018-12-15 NOTE — Progress Notes (Addendum)
Eduardo Osier here for Depo-Provera  Injection.  Injection administered without complication. Patient will return in 3 months for next injection.  Loma Sousa, [place] 12/15/18 5681  Attestation of Attending Supervision of RN: Evaluation and management procedures were performed by the nurse under my supervision and collaboration.  I have reviewed the nursing note and chart, and I agree with the management and plan.  Carolyn L. Harraway-Smith, M.D., Cherlynn June

## 2019-03-02 ENCOUNTER — Other Ambulatory Visit: Payer: Self-pay

## 2019-03-02 ENCOUNTER — Ambulatory Visit (INDEPENDENT_AMBULATORY_CARE_PROVIDER_SITE_OTHER): Payer: BC Managed Care – PPO

## 2019-03-02 VITALS — BP 117/79 | HR 74 | Wt 223.1 lb

## 2019-03-02 DIAGNOSIS — Z3042 Encounter for surveillance of injectable contraceptive: Secondary | ICD-10-CM

## 2019-03-02 NOTE — Progress Notes (Signed)
Chart reviewed - agree with RN documentation.   

## 2019-03-02 NOTE — Progress Notes (Signed)
Stephanie Wells here for Depo-Provera  Injection.  Injection administered without complication. Patient will return in 3 months for next injection.  Pt states she would like to begin care with a PCP. List of primary care offices accepting new patients added to AVS and pt given instructions on making new pt appt.  Annabell Howells, RN 03/02/2019  9:05 AM

## 2019-03-02 NOTE — Patient Instructions (Signed)
These offices are accepting new patient appointments. Please call to schedule an appointment:  [facility] [address] [place],  [place]  [address] [phone]  [facility] at [place], [place] [address] [place],  [place]  [address] [phone]

## 2019-05-15 ENCOUNTER — Ambulatory Visit (HOSPITAL_COMMUNITY)
Admission: EM | Admit: 2019-05-15 | Discharge: 2019-05-15 | Disposition: A | Discharge status: Home / Self Care | Payer: BC Managed Care – PPO | Attending: Family Medicine | Admitting: Family Medicine

## 2019-05-15 ENCOUNTER — Other Ambulatory Visit: Payer: Self-pay

## 2019-05-15 ENCOUNTER — Encounter (HOSPITAL_COMMUNITY): Payer: Self-pay | Admitting: Family Medicine

## 2019-05-15 DIAGNOSIS — K047 Periapical abscess without sinus: Secondary | ICD-10-CM

## 2019-05-15 MED ORDER — HYDROCODONE-ACETAMINOPHEN 5-325 MG PO TABS: 1.0000 | ORAL_TABLET | Freq: Four times a day (QID) | ORAL | 0 refills | Status: DC | PRN

## 2019-05-15 MED ORDER — AMOXICILLIN-POT CLAVULANATE 875-125 MG PO TABS: 1.0000 | ORAL_TABLET | Freq: Two times a day (BID) | ORAL | 0 refills | Status: DC

## 2019-05-15 NOTE — ED Triage Notes (Signed)
Reports starting with right lower tooth pain intermittently over past few days.  Last night tooth pain became constant and severe.  Reports an abscess to area.  Denies fevers.

## 2019-05-15 NOTE — ED Provider Notes (Signed)
[facility]    CSN: [id number] Arrival date & time: 05/15/19  1333      History   Chief Complaint Chief Complaint  Patient presents with  . Dental Pain    HPI Stephanie Wells is a 39 y.o. female.   This is a 39 year old woman making her initial visit to [facility] urgent care.  She is complaining of mouth pain.  Reports starting with right lower tooth pain intermittently over past few days.  Last night tooth pain became constant and severe.  Reports an abscess to area.  Denies fevers.  Patient works in a pharmacy  Pain is 9/10      Past Medical History:  Diagnosis Date  . Asthma   . Diabetes mellitus without complication (HCC)   . Gestational diabetes   . Hypertension    history of pre-eclampsia  . Vaginal Pap smear, abnormal    had colpo f/u wnl    Patient Active Problem List   Diagnosis Date Noted  . History of gestational diabetes mellitus, not currently pregnant 07/27/2015  . 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 Allergic state 04/10/2015    Past Surgical History:  Procedure Laterality Date  . BREAST CYST EXCISION Left   . VAGINAL DELIVERY    . WISDOM TOOTH EXTRACTION      OB History    Gravida  7   Para  6   Term  6   Preterm  0   AB  1   Living  6     SAB  1   TAB  0   Ectopic  0   Multiple  0   Live Births  6            Home Medications    Prior to Admission medications   Medication Sig Start Date End Date Taking? Authorizing Provider  Ascorbic Acid (VITAMIN C PO) Take by mouth.   Yes [provider]  ELDERBERRY PO Take by mouth.   Yes [provider]  ibuprofen (ADVIL,MOTRIN) 600 MG tablet Take 1 tablet (600 mg total) by mouth every 6 (six) hours. 06/19/15  Yes Caesar Chestnut, MD  acetaminophen (TYLENOL) 325 MG tablet Take 2 tablets (650 mg total) by mouth every 4 (four) hours as needed (for pain scale < 4). 06/19/15   Caesar Chestnut, MD  albuterol (PROVENTIL HFA;VENTOLIN HFA) 108 (90 Base)  MCG/ACT inhaler Inhale 1 puff into the lungs every 4 (four) hours as needed for wheezing or shortness of breath. 01/31/16   Rasch, Victorino Dike I, NP  amoxicillin-clavulanate (AUGMENTIN) 875-125 MG tablet Take 1 tablet by mouth every 12 (twelve) hours. 05/15/19   Elvina Sidle, MD  HYDROcodone-acetaminophen (NORCO) 5-325 MG tablet Take 1 tablet by mouth every 6 (six) hours as needed for moderate pain. 05/15/19   Elvina Sidle, MD  Prenatal Vit-Fe Fumarate-FA (PRENATAL VITAMINS) 28-0.8 MG TABS Take 1 tablet by mouth.     [provider]    Family History Family History  Problem Relation Age of Onset  . Varicose Veins Mother   . COPD Mother   . Heart disease Father   . Cancer Maternal Grandmother        lung cancer    Social History Social History   Tobacco Use  . Smoking status: Former Games developer  . Smokeless tobacco: Never Used  Substance Use Topics  . Alcohol use: No  . Drug use: No     Allergies   Patient has no known allergies.  Review of Systems Review of Systems  HENT: Positive for dental problem.   All other systems reviewed and are negative.    Physical Exam Triage Vital Signs ED Triage Vitals  Enc Vitals Group     BP      Pulse      Resp      Temp      Temp src      SpO2      Weight      Height      Head Circumference      Peak Flow      Pain Score      Pain Loc      Pain Edu?      Excl. in [place]?    No data found.  Updated Vital Signs BP 140/75   Pulse 66   Temp 98.7 F (37.1 C) (Oral)   Resp 16   SpO2 100%    Physical Exam Vitals and nursing note reviewed.  Constitutional:      Appearance: Normal appearance. She is obese.  HENT:     Mouth/Throat:     Mouth: Mucous membranes are moist.     Comments: Marked right facial swelling over tooth with carie (#28) Cardiovascular:     Rate and Rhythm: Normal rate.  Pulmonary:     Effort: Pulmonary effort is normal.  Musculoskeletal:        General: Normal range of motion.      Cervical back: Normal range of motion and neck supple.  Skin:    General: Skin is warm and dry.  Neurological:     General: No focal deficit present.     Mental Status: She is alert.  Psychiatric:        Mood and Affect: Mood normal.      UC Treatments / Results  Labs (all labs ordered are listed, but only abnormal results are displayed) Labs Reviewed - No data to display  EKG   Radiology No results found.  Procedures Procedures (including critical care time)  Medications Ordered in UC Medications - No data to display  Initial Impression / Assessment and Plan / UC Course  I have reviewed the triage vital signs and the nursing notes.  Pertinent labs & imaging results that were available during my care of the patient were reviewed by me and considered in my medical decision making (see chart for details).    Final Clinical Impressions(s) / UC Diagnoses   Final diagnoses:  Dental abscess     Discharge Instructions     You will need to see a dentist to keep this from recurring in the future.    ED Prescriptions    Medication Sig Dispense Auth. Provider   HYDROcodone-acetaminophen (NORCO) 5-325 MG tablet Take 1 tablet by mouth every 6 (six) hours as needed for moderate pain. 12 tablet Robyn Haber, MD   amoxicillin-clavulanate (AUGMENTIN) 875-125 MG tablet Take 1 tablet by mouth every 12 (twelve) hours. 14 tablet Robyn Haber, MD     I have reviewed the PDMP during this encounter.   Robyn Haber, MD 05/15/19 1644

## 2019-05-15 NOTE — Discharge Instructions (Addendum)
You will need to see a dentist to keep this from recurring in the future.

## 2019-05-18 ENCOUNTER — Other Ambulatory Visit: Payer: Self-pay

## 2019-05-18 ENCOUNTER — Ambulatory Visit (INDEPENDENT_AMBULATORY_CARE_PROVIDER_SITE_OTHER): Payer: BC Managed Care – PPO | Admitting: General Practice

## 2019-05-18 VITALS — BP 118/70 | HR 69 | Ht 66.0 in | Wt 223.0 lb

## 2019-05-18 DIAGNOSIS — Z3042 Encounter for surveillance of injectable contraceptive: Secondary | ICD-10-CM

## 2019-05-18 NOTE — Progress Notes (Addendum)
Stephanie Wells here for Depo-Provera  Injection.  Injection administered without complication. Patient will return in 3 months for next injection.  Marylynn Pearson, RN 05/18/2019  9:32 AM   Patient seen and assessed by nursing staff during this encounter. I have reviewed the chart and agree with the documentation and plan.  Marylen Ponto, NP 05/18/2019 1:54 PM

## 2019-08-03 ENCOUNTER — Ambulatory Visit: Payer: BC Managed Care – PPO

## 2019-08-03 ENCOUNTER — Telehealth: Payer: Self-pay | Admitting: Obstetrics and Gynecology

## 2019-08-03 NOTE — Telephone Encounter (Signed)
Patient stated she forgot about her appointment, but she wants to transfer to the [facility] after she gets her depo injection. It's much closer to home.

## 2019-08-10 ENCOUNTER — Encounter: Payer: Self-pay | Admitting: *Deleted

## 2019-08-10 ENCOUNTER — Ambulatory Visit (INDEPENDENT_AMBULATORY_CARE_PROVIDER_SITE_OTHER): Payer: BC Managed Care – PPO | Admitting: *Deleted

## 2019-08-10 ENCOUNTER — Other Ambulatory Visit: Payer: Self-pay

## 2019-08-10 VITALS — BP 118/82 | HR 81 | Temp 97.9°F | Ht 66.0 in | Wt 215.4 lb

## 2019-08-10 DIAGNOSIS — Z3042 Encounter for surveillance of injectable contraceptive: Secondary | ICD-10-CM | POA: Diagnosis not present

## 2019-08-10 MED ORDER — MEDROXYPROGESTERONE ACETATE 150 MG/ML IM SUSP
150.0000 mg | Freq: Once | INTRAMUSCULAR | Status: AC
  Administered 2019-08-10: 150 mg via INTRAMUSCULAR

## 2019-08-10 NOTE — Progress Notes (Signed)
Depo Provera 150 mg IM administered as scheduled. Pt tolerated well. Next injection 8/24-9/7. Pt stated she is having difficulty with sleep and low libido. Pt was informed of some sleep aid options ( Melatonin, Unisom, Benadryl). She was advised she is due for Annual Gyn exam (last 08/2017). These health concerns can be discussed further during that visit. Next pap due 08/2020. Pt voiced understanding of all information and instructions given.

## 2019-08-10 NOTE — Progress Notes (Signed)
Chart reviewed for nurse visit. Agree with plan of care.   Marny Lowenstein, PA-C 08/10/2019 11:26 AM

## 2019-09-08 ENCOUNTER — Encounter: Payer: Self-pay | Admitting: Medical

## 2019-09-08 ENCOUNTER — Other Ambulatory Visit (HOSPITAL_COMMUNITY)
Admission: RE | Admit: 2019-09-08 | Discharge: 2019-09-08 | Disposition: A | Discharge status: Home / Self Care | Payer: BC Managed Care – PPO | Source: Ambulatory Visit | Attending: Medical | Admitting: Medical

## 2019-09-08 ENCOUNTER — Ambulatory Visit (INDEPENDENT_AMBULATORY_CARE_PROVIDER_SITE_OTHER): Payer: BC Managed Care – PPO | Admitting: Medical

## 2019-09-08 ENCOUNTER — Other Ambulatory Visit: Payer: Self-pay

## 2019-09-08 VITALS — Ht 65.0 in | Wt 218.0 lb

## 2019-09-08 DIAGNOSIS — Z1231 Encounter for screening mammogram for malignant neoplasm of breast: Secondary | ICD-10-CM

## 2019-09-08 DIAGNOSIS — Z01419 Encounter for gynecological examination (general) (routine) without abnormal findings: Secondary | ICD-10-CM | POA: Insufficient documentation

## 2019-09-08 NOTE — Progress Notes (Signed)
    History:  Ms. Stephanie Wells is a 39 y.o. [phone] who presents to clinic today for annual exam with pap smear and STD testing. She is currently on Depo Provera for birth control and does not have periods. She spots a few times per year. She states a remote history of abnormal pap with colposcopy and biopsy. Last 2 paps in Epic were normal, last in 2019. She is requesting pap today with STD testing. She has never had a mammogram.   The following portions of the patient's history were reviewed and updated as appropriate: allergies, current medications, family history, past medical history, social history, past surgical history and problem list.  Review of Systems:  Review of Systems  Constitutional: Negative for fever and malaise/fatigue.  Gastrointestinal: Negative for abdominal pain, constipation, diarrhea, nausea and vomiting.  Genitourinary: Negative for dysuria, frequency and urgency.       Neg - vaginal bleeding, discharge      Objective:  Physical Exam Ht 5\' 5"  (1.651 m)   Wt 218 lb (98.9 kg)   BMI 36.28 kg/m  Physical Exam Vitals and nursing note reviewed. Exam conducted with a chaperone present.  Constitutional:      General: She is not in acute distress.    Appearance: She is well-developed.  HENT:     Head: Normocephalic and atraumatic.  Eyes:     Extraocular Movements: Extraocular movements intact.     Conjunctiva/sclera: Conjunctivae normal.  Cardiovascular:     Rate and Rhythm: Normal rate and regular rhythm.     Heart sounds: No murmur heard.   Pulmonary:     Effort: Pulmonary effort is normal. No respiratory distress.     Breath sounds: Normal breath sounds. No wheezing.  Abdominal:     General: Bowel sounds are normal. There is no distension.     Palpations: Abdomen is soft. There is no mass.     Tenderness: There is no abdominal tenderness. There is no guarding or rebound.  Genitourinary:    Vagina: No vaginal discharge or bleeding.     Cervix: No  cervical motion tenderness, discharge or friability.     Uterus: Not enlarged and not tender.      Adnexa:        Right: No mass or tenderness.         Left: No mass or tenderness.    Musculoskeletal:     Cervical back: Neck supple.  Skin:    General: Skin is warm and dry.     Findings: No erythema.  Neurological:     Mental Status: She is alert and oriented to person, place, and time.  Psychiatric:        Mood and Affect: Mood normal.     Assessment & Plan:  1. Well woman exam with routine gynecological exam - Cytology - PAP( Yaak) - HIV Antibody (routine testing w rflx) - RPR - Hepatitis C antibody - Hepatitis B surface antigen - Patient will receive results through MyChart. Advised that a normal pap today would mean next pap due 2024.  - Mammogram ordered   Approximately 20 minutes total time was spent with this patient   2025 09/09/2019 10:11 AM

## 2019-09-08 NOTE — Patient Instructions (Signed)

## 2019-09-09 LAB — CYTOLOGY - PAP
Chlamydia: NEGATIVE
Comment: NEGATIVE
Comment: NEGATIVE
Comment: NEGATIVE
Comment: NORMAL
Diagnosis: NEGATIVE
High risk HPV: NEGATIVE
Neisseria Gonorrhea: NEGATIVE
Trichomonas: NEGATIVE

## 2019-09-09 LAB — HIV ANTIBODY (ROUTINE TESTING W REFLEX): HIV Screen 4th Generation wRfx: NONREACTIVE

## 2019-09-09 LAB — HEPATITIS B SURFACE ANTIGEN: Hepatitis B Surface Ag: NEGATIVE

## 2019-09-09 LAB — RPR: RPR Ser Ql: NONREACTIVE

## 2019-09-09 LAB — HEPATITIS C ANTIBODY: Hep C Virus Ab: <0.1 s/co ratio (ref 0.0–0.9)

## 2019-09-24 ENCOUNTER — Other Ambulatory Visit: Payer: Self-pay | Admitting: Medical

## 2019-09-24 DIAGNOSIS — Z1231 Encounter for screening mammogram for malignant neoplasm of breast: Secondary | ICD-10-CM

## 2019-09-28 ENCOUNTER — Other Ambulatory Visit: Payer: Self-pay

## 2019-09-28 ENCOUNTER — Ambulatory Visit
Admission: RE | Admit: 2019-09-28 | Discharge: 2019-09-28 | Disposition: A | Discharge status: Home / Self Care | Payer: BC Managed Care – PPO | Source: Ambulatory Visit | Attending: Medical | Admitting: Medical

## 2019-09-28 DIAGNOSIS — Z1231 Encounter for screening mammogram for malignant neoplasm of breast: Secondary | ICD-10-CM

## 2019-10-26 ENCOUNTER — Other Ambulatory Visit: Payer: Self-pay

## 2019-10-26 ENCOUNTER — Encounter: Payer: Self-pay | Admitting: Lactation Services

## 2019-10-26 ENCOUNTER — Ambulatory Visit (INDEPENDENT_AMBULATORY_CARE_PROVIDER_SITE_OTHER): Payer: BC Managed Care – PPO | Admitting: Lactation Services

## 2019-10-26 VITALS — BP 134/80 | HR 77 | Ht 65.0 in | Wt 222.2 lb

## 2019-10-26 DIAGNOSIS — Z3042 Encounter for surveillance of injectable contraceptive: Secondary | ICD-10-CM | POA: Diagnosis not present

## 2019-10-26 MED ORDER — MEDROXYPROGESTERONE ACETATE 150 MG/ML IM SUSP
150.0000 mg | Freq: Once | INTRAMUSCULAR | Status: AC
  Administered 2019-10-26: 150 mg via INTRAMUSCULAR

## 2019-10-26 NOTE — Progress Notes (Addendum)
[name] here for Depo-Provera  Injection.  Injection administered without complication. Patient will return in 3 months for next injection. Patient tolerated well.   Ed Blalock, RN 10/26/2019  9:20 AM       Patient was assessed and managed by nursing staff during this encounter. I have reviewed the chart and agree with the documentation and plan. I have also made any necessary editorial changes.  Marylen Ponto, NP 10/26/2019 9:33 AM

## 2020-01-26 ENCOUNTER — Other Ambulatory Visit: Payer: Self-pay

## 2020-01-26 ENCOUNTER — Ambulatory Visit (INDEPENDENT_AMBULATORY_CARE_PROVIDER_SITE_OTHER): Payer: BC Managed Care – PPO | Admitting: *Deleted

## 2020-01-26 ENCOUNTER — Encounter: Payer: Self-pay | Admitting: *Deleted

## 2020-01-26 VITALS — BP 111/74 | HR 79 | Ht 66.0 in | Wt 225.0 lb

## 2020-01-26 DIAGNOSIS — Z3042 Encounter for surveillance of injectable contraceptive: Secondary | ICD-10-CM | POA: Diagnosis not present

## 2020-01-26 MED ORDER — MEDROXYPROGESTERONE ACETATE 150 MG/ML IM SUSP
150.0000 mg | Freq: Once | INTRAMUSCULAR | Status: AC
  Administered 2020-01-26: 150 mg via INTRAMUSCULAR

## 2020-01-26 NOTE — Progress Notes (Signed)
Depo Provera 150 mg IM administered as scheduled. Pt tolerated well. Last Pap (normal) and Annual Gyn exam was on 09/08/19. Next Depo due 2/9-2/23/22.

## 2020-04-11 ENCOUNTER — Other Ambulatory Visit: Payer: Self-pay

## 2020-04-11 ENCOUNTER — Ambulatory Visit (INDEPENDENT_AMBULATORY_CARE_PROVIDER_SITE_OTHER): Payer: BC Managed Care – PPO | Admitting: General Practice

## 2020-04-11 VITALS — BP 134/82 | HR 78 | Ht 66.0 in | Wt 232.0 lb

## 2020-04-11 DIAGNOSIS — Z3042 Encounter for surveillance of injectable contraceptive: Secondary | ICD-10-CM

## 2020-04-11 MED ORDER — MEDROXYPROGESTERONE ACETATE 150 MG/ML IM SUSP
150.0000 mg | Freq: Once | INTRAMUSCULAR | Status: AC
  Administered 2020-04-11: 150 mg via INTRAMUSCULAR

## 2020-04-11 NOTE — Progress Notes (Signed)
Patient was assessed and managed by nursing staff during this encounter. I have reviewed the chart and agree with the documentation and plan.  Bernerd Limbo, CNM 04/11/2020 11:05 AM

## 2020-04-11 NOTE — Progress Notes (Signed)
Stephanie Wells here for Depo-Provera Injection. Injection administered without complication. Patient will return in 3 months for next injection between April 26 and May 10. Next annual visit due July 2022.   Marylynn Pearson, RN 04/11/2020  10:17 AM

## 2020-06-27 ENCOUNTER — Ambulatory Visit (INDEPENDENT_AMBULATORY_CARE_PROVIDER_SITE_OTHER): Payer: BC Managed Care – PPO

## 2020-06-27 ENCOUNTER — Other Ambulatory Visit: Payer: Self-pay

## 2020-06-27 VITALS — BP 121/82 | HR 66 | Wt 218.7 lb

## 2020-06-27 DIAGNOSIS — Z3042 Encounter for surveillance of injectable contraceptive: Secondary | ICD-10-CM

## 2020-06-27 MED ORDER — MEDROXYPROGESTERONE ACETATE 150 MG/ML IM SUSP
150.0000 mg | Freq: Once | INTRAMUSCULAR | Status: AC
  Administered 2020-06-27: 150 mg via INTRAMUSCULAR

## 2020-06-27 NOTE — Progress Notes (Signed)
Stephanie Wells here for Depo-Provera Injection. Injection administered without complication. Patient will return in 3 months for next injection between July 12 and July 26. Next annual visit due 09/2020. Pt advised to schedule her next Depo Provera with a provider visit for an annual.  Pt verbalized understanding.    Ralene Bathe, RN 06/27/2020  8:53 AM

## 2020-06-27 NOTE — Progress Notes (Signed)
ATTESTATION OF SUPERVISION OF RN: Evaluation and management procedures were performed by the RN under my supervision and collaboration. I have reviewed the nursing note and chart and agree with the management and plan for this patient.  Danyella Mcginty, CNM  

## 2020-09-19 ENCOUNTER — Encounter: Payer: Self-pay | Admitting: *Deleted

## 2020-09-19 ENCOUNTER — Other Ambulatory Visit: Payer: Self-pay

## 2020-09-19 ENCOUNTER — Encounter: Payer: Self-pay | Admitting: Student

## 2020-09-19 ENCOUNTER — Ambulatory Visit: Payer: BC Managed Care – PPO

## 2020-09-19 ENCOUNTER — Ambulatory Visit (INDEPENDENT_AMBULATORY_CARE_PROVIDER_SITE_OTHER): Payer: BC Managed Care – PPO | Admitting: Student

## 2020-09-19 VITALS — BP 106/72 | HR 75 | Ht 66.0 in | Wt 223.0 lb

## 2020-09-19 DIAGNOSIS — Z3042 Encounter for surveillance of injectable contraceptive: Secondary | ICD-10-CM | POA: Diagnosis not present

## 2020-09-19 DIAGNOSIS — Z0000 Encounter for general adult medical examination without abnormal findings: Secondary | ICD-10-CM | POA: Insufficient documentation

## 2020-09-19 DIAGNOSIS — Z3202 Encounter for pregnancy test, result negative: Secondary | ICD-10-CM | POA: Diagnosis not present

## 2020-09-19 DIAGNOSIS — Z01419 Encounter for gynecological examination (general) (routine) without abnormal findings: Secondary | ICD-10-CM

## 2020-09-19 LAB — POCT PREGNANCY, URINE: Preg Test, Ur: NEGATIVE

## 2020-09-19 MED ORDER — MEDROXYPROGESTERONE ACETATE 150 MG/ML IM SUSP
150.0000 mg | Freq: Once | INTRAMUSCULAR | Status: AC
  Administered 2020-09-19: 150 mg via INTRAMUSCULAR

## 2020-09-19 NOTE — Progress Notes (Signed)
Patient ID: Stephanie Wells, female   DOB: 09/24/1980, 40 y.o.   MRN: [id number]  History:  Stephanie Wells is a 40 y.o. [id number] who presents to clinic today for annual for depo. Not due for pap (in three years); she is due for mammo and is planning to call this week-has the reminder at home. Declines for [place] to make appt today.   The following portions of the patient's history were reviewed and updated as appropriate: allergies, current medications, family history, past medical history, social history, past surgical history and problem list.  Review of Systems:  Review of Systems  Constitutional: Negative.   HENT: Negative.    Respiratory: Negative.    Cardiovascular: Negative.   Genitourinary: Negative.   Skin: Negative.   Neurological: Negative.      Objective:  Physical Exam BP 106/72   Pulse 75   Ht 5\' 6"  (1.676 m)   BMI 35.30 kg/m  Physical Exam Constitutional:      Appearance: Normal appearance.  Cardiovascular:     Rate and Rhythm: Normal rate and regular rhythm.     Pulses: Normal pulses.  Pulmonary:     Effort: Pulmonary effort is normal.     Breath sounds: Normal breath sounds.  Abdominal:     General: Abdomen is flat.  Musculoskeletal:     Cervical back: Normal range of motion.  Skin:    General: Skin is warm.     Capillary Refill: Capillary refill takes less than 2 seconds.  Neurological:     Mental Status: She is alert.  Breast exam benign: no lumps, masses, tenderness, breasts are equal in size and symmetrical.     Labs and Imaging Results for orders placed or performed in visit on 09/19/20 (from the past 24 hour(s))  Pregnancy, urine POC     Status: None   Collection Time: 09/19/20 10:58 AM  Result Value Ref Range   Preg Test, Ur NEGATIVE NEGATIVE    No results found.  Health Maintenance Due  Topic Date Due   HEMOGLOBIN A1C  Never done   PNEUMOCOCCAL POLYSACCHARIDE VACCINE AGE 45-64 HIGH RISK  Never done   COVID-19 Vaccine (1) Never done    FOOT EXAM  Never done   OPHTHALMOLOGY EXAM  Never done   URINE MICROALBUMIN  Never done   TETANUS/TDAP  12/15/2017     Assessment & Plan:   1. Encounter for surveillance of injectable contraceptive   2. Annual physical exam    -pregnancy test negative; ok to give Depo -she will make appt for mammo-has reminder at home and declines for 12/17/2017 to call -patient declines pelvic due to no complaints   Approximately 15 minutes of total time was spent with this patient on counseling and coordination of care  [place], CNM 09/19/2020 11:44 AM

## 2020-09-19 NOTE — Progress Notes (Signed)
Patient is here for her annual visit along with Depo Provera injection. Depo Provera 150 mg administered into left deltoid without any complications. Patient will return in 3 months between October 4 and December 19, 2020 to receive next injection.    Dawayne Patricia, CMA  09-19-20

## 2020-09-19 NOTE — Progress Notes (Signed)
Here for nurse visit for depo-provera. Per chart has been 1 year and about 2 weeks since last annual. Has family planning medicaid . Changed to provider schedule for annual and depo. Informed patient. Libbie Bartley,RN

## 2020-12-05 ENCOUNTER — Ambulatory Visit: Payer: BC Managed Care – PPO

## 2020-12-06 ENCOUNTER — Ambulatory Visit (INDEPENDENT_AMBULATORY_CARE_PROVIDER_SITE_OTHER): Payer: BC Managed Care – PPO

## 2020-12-06 ENCOUNTER — Other Ambulatory Visit: Payer: Self-pay

## 2020-12-06 VITALS — BP 125/73 | HR 79 | Wt 228.3 lb

## 2020-12-06 DIAGNOSIS — Z3042 Encounter for surveillance of injectable contraceptive: Secondary | ICD-10-CM

## 2020-12-06 MED ORDER — MEDROXYPROGESTERONE ACETATE 150 MG/ML IM SUSP
150.0000 mg | Freq: Once | INTRAMUSCULAR | Status: AC
  Administered 2020-12-06: 150 mg via INTRAMUSCULAR

## 2020-12-06 NOTE — Progress Notes (Signed)
Stephanie Wells here for Depo-Provera Injection. Injection administered without complication. Patient will return in 3 months for next injection between December 21 and January 4th. Next annual visit due 09/2021.   Ralene Bathe, RN 12/06/2020  10:36 AM

## 2020-12-06 NOTE — Progress Notes (Signed)
Patient was assessed and managed by nursing staff during this encounter. I have reviewed the chart and agree with the documentation and plan. I have also made any necessary editorial changes.  Warden Fillers, MD 12/06/2020 1:00 PM

## 2021-02-02 ENCOUNTER — Other Ambulatory Visit: Payer: Self-pay | Admitting: Physician Assistant

## 2021-02-02 DIAGNOSIS — M2392 Unspecified internal derangement of left knee: Secondary | ICD-10-CM

## 2021-02-27 ENCOUNTER — Ambulatory Visit (INDEPENDENT_AMBULATORY_CARE_PROVIDER_SITE_OTHER): Payer: BC Managed Care – PPO

## 2021-02-27 ENCOUNTER — Other Ambulatory Visit: Payer: Self-pay

## 2021-02-27 VITALS — BP 131/82 | HR 78 | Wt 231.2 lb

## 2021-02-27 DIAGNOSIS — Z1231 Encounter for screening mammogram for malignant neoplasm of breast: Secondary | ICD-10-CM

## 2021-02-27 DIAGNOSIS — Z3042 Encounter for surveillance of injectable contraceptive: Secondary | ICD-10-CM

## 2021-02-27 DIAGNOSIS — J45909 Unspecified asthma, uncomplicated: Secondary | ICD-10-CM | POA: Insufficient documentation

## 2021-02-27 MED ORDER — MEDROXYPROGESTERONE ACETATE 150 MG/ML IM SUSP
150.0000 mg | Freq: Once | INTRAMUSCULAR | Status: AC
  Administered 2021-02-27: 11:00:00 150 mg via INTRAMUSCULAR

## 2021-02-27 NOTE — Progress Notes (Signed)
Stephanie Wells here for Depo-Provera Injection. Injection administered without complication. Patient will return in 3 months for next injection between 05/15/21 and 05/29/21. Next annual visit due July 2023. Pt reports she was unable to schedule mammogram with [facility] following annual visit in July 2022. Last mammogram performed 09/28/19. Mammogram ordered and scheduled for 03/13/21.  Marjo Bicker, RN 02/27/2021  10:10 AM

## 2021-02-28 NOTE — Progress Notes (Signed)
Chart reviewed for nurse visit. Agree with plan of care.   Warner Mccreedy, MD 02/28/2021 9:02 PM

## 2021-03-15 ENCOUNTER — Ambulatory Visit
Admission: RE | Admit: 2021-03-15 | Discharge: 2021-03-15 | Disposition: A | Discharge status: Home / Self Care | Payer: BC Managed Care – PPO | Source: Ambulatory Visit | Attending: Family Medicine | Admitting: Family Medicine

## 2021-03-15 DIAGNOSIS — Z1231 Encounter for screening mammogram for malignant neoplasm of breast: Secondary | ICD-10-CM

## 2021-05-16 ENCOUNTER — Other Ambulatory Visit: Payer: Self-pay

## 2021-05-16 ENCOUNTER — Ambulatory Visit (INDEPENDENT_AMBULATORY_CARE_PROVIDER_SITE_OTHER): Payer: BC Managed Care – PPO | Admitting: General Practice

## 2021-05-16 VITALS — BP 121/74 | HR 72 | Ht 66.0 in | Wt 240.0 lb

## 2021-05-16 DIAGNOSIS — Z3042 Encounter for surveillance of injectable contraceptive: Secondary | ICD-10-CM | POA: Diagnosis not present

## 2021-05-16 MED ORDER — MEDROXYPROGESTERONE ACETATE 150 MG/ML IM SUSP
150.0000 mg | Freq: Once | INTRAMUSCULAR | Status: AC
  Administered 2021-05-16: 150 mg via INTRAMUSCULAR

## 2021-05-16 NOTE — Progress Notes (Signed)
Eduardo Osier here for Depo-Provera Injection. Injection administered without complication. Patient will return in 3 months for next injection between May 31 and June 14. Next annual visit due July 2023.  ? ?Derinda Late, RN ?05/16/2021  10:41 AM  ?

## 2021-08-01 ENCOUNTER — Ambulatory Visit (INDEPENDENT_AMBULATORY_CARE_PROVIDER_SITE_OTHER): Payer: BC Managed Care – PPO

## 2021-08-01 VITALS — BP 123/88 | HR 84 | Wt 228.8 lb

## 2021-08-01 DIAGNOSIS — Z3042 Encounter for surveillance of injectable contraceptive: Secondary | ICD-10-CM

## 2021-08-01 MED ORDER — MEDROXYPROGESTERONE ACETATE 150 MG/ML IM SUSP
150.0000 mg | Freq: Once | INTRAMUSCULAR | Status: AC
  Administered 2021-08-01: 150 mg via INTRAMUSCULAR

## 2021-08-01 NOTE — Progress Notes (Signed)
Stephanie Wells here for Depo-Provera Injection. Injection administered without complication. Patient will return in 3 months for next injection between Aug 16th and Aug 30th. Next annual visit with be scheduled with her next Depo Provera administration.  Front office requested to schedule her appt.  Pt verbalized understanding to all that was discussed.    Ralene Bathe, RN 08/01/2021  11:59 AM

## 2021-10-23 ENCOUNTER — Encounter: Payer: Self-pay | Admitting: Advanced Practice Midwife

## 2021-10-23 ENCOUNTER — Ambulatory Visit (INDEPENDENT_AMBULATORY_CARE_PROVIDER_SITE_OTHER): Payer: BC Managed Care – PPO | Admitting: Advanced Practice Midwife

## 2021-10-23 ENCOUNTER — Other Ambulatory Visit: Payer: Self-pay

## 2021-10-23 VITALS — BP 120/79 | HR 76 | Ht 66.0 in | Wt 228.5 lb

## 2021-10-23 DIAGNOSIS — Z8759 Personal history of other complications of pregnancy, childbirth and the puerperium: Secondary | ICD-10-CM

## 2021-10-23 DIAGNOSIS — Z1231 Encounter for screening mammogram for malignant neoplasm of breast: Secondary | ICD-10-CM

## 2021-10-23 DIAGNOSIS — Z3042 Encounter for surveillance of injectable contraceptive: Secondary | ICD-10-CM

## 2021-10-23 DIAGNOSIS — R6882 Decreased libido: Secondary | ICD-10-CM

## 2021-10-23 DIAGNOSIS — Z0000 Encounter for general adult medical examination without abnormal findings: Secondary | ICD-10-CM

## 2021-10-23 DIAGNOSIS — Z8632 Personal history of gestational diabetes: Secondary | ICD-10-CM

## 2021-10-23 HISTORY — DX: Personal history of other complications of pregnancy, childbirth and the puerperium: Z87.59

## 2021-10-23 HISTORY — DX: Personal history of gestational diabetes: Z86.32

## 2021-10-23 MED ORDER — MEDROXYPROGESTERONE ACETATE 150 MG/ML IM SUSP
150.0000 mg | Freq: Once | INTRAMUSCULAR | Status: AC
  Administered 2021-10-23: 150 mg via INTRAMUSCULAR

## 2021-10-23 NOTE — Progress Notes (Signed)
Stephanie Wells here for Depo-Provera Injection. Injection administered without complication. Patient will return in 3 months for next injection between Nov 7 and Nov 21. Next annual visit due August 24.  Guy Begin, CMA 10/23/2021  2:28 PM

## 2021-10-23 NOTE — Progress Notes (Signed)
Stephanie Wells here for Depo-Provera Injection. Injection administered without complication. Patient will return in 3 months for next injection between 11/7 and 11/21. Next annual visit due September 2024 .   Christella Scheuermann, RN 10/23/2021  5:08 PM

## 2021-10-23 NOTE — Progress Notes (Signed)
GYNECOLOGY ANNUAL PREVENTATIVE CARE ENCOUNTER NOTE  History:     Stephanie Wells is a 41 y.o. [phone] female here for a routine annual gynecologic exam.  Current complaints: spotting, absent libido.  Denies heavy vaginal bleeding, discharge, pelvic pain, problems with intercourse or other gynecologic concerns.    Gynecologic History No LMP recorded. Patient has had an injection. Contraception: Depo-Provera injections Last Pap: 2021 Results were: normal with negative HPV Last mammogram: 03/2021. Results were: normal  Obstetric History OB History  Gravida Para Term Preterm AB Living  7 6 6  0 1 6  SAB IAB Ectopic Multiple Live Births  1 0 0 0 6    # Outcome Date GA Lbr Len/2nd Weight Sex Delivery Anes PTL Lv  7 Term 06/18/15 [redacted]w[redacted]d 05:45 / 00:12 7 lb 7.4 oz (3.385 kg) F Vag-Spont None  LIV  6 Term 06/18/12 [redacted]w[redacted]d  6 lb 7 oz (2.92 kg) F Vag-Spont  N LIV     Complications: GDM (gestational diabetes mellitus)  5 Term 03/13/09 [redacted]w[redacted]d  9 lb 15 oz (4.508 kg) F Vag-Spont  N LIV     Complications: IDDM (insulin dependent diabetes mellitus)  4 SAB 2010          3 Term 03/13/06 [redacted]w[redacted]d  7 lb 6 oz (3.345 kg) M Vag-Spont  N LIV     Complications: Pre-eclampsia  2 Term 10/22/04 [redacted]w[redacted]d  9 lb 13 oz (4.451 kg) M Vag-Spont  N LIV     Complications: GDM (gestational diabetes mellitus)  1 Term 05/29/85 [redacted]w[redacted]d  8 lb 6 oz (3.799 kg) F Vag-Spont  N LIV     Complications: GDM (gestational diabetes mellitus), Pre-eclampsia    Past Medical History:  Diagnosis Date   Asthma    Diabetes mellitus without complication (HCC)    Gestational diabetes    Hypertension    history of pre-eclampsia   Vaginal Pap smear, abnormal    had colpo f/u wnl    Past Surgical History:  Procedure Laterality Date   BREAST CYST EXCISION Left    VAGINAL DELIVERY     WISDOM TOOTH EXTRACTION      Current Outpatient Medications on File Prior to Visit  Medication Sig Dispense Refill   albuterol (VENTOLIN HFA) 108 (90 Base)  MCG/ACT inhaler Inhale into the lungs.     medroxyPROGESTERone (DEPO-PROVERA) 150 MG/ML injection Inject 150 mg into the muscle every 3 (three) months.     meloxicam (MOBIC) 15 MG tablet Take 15 mg by mouth daily.     methocarbamol (ROBAXIN) 500 MG tablet Take by mouth.     HYDROcodone-acetaminophen (NORCO/VICODIN) 5-325 MG tablet Take 1 tablet by mouth every 6 (six) hours as needed. (Patient not taking: Reported on 02/27/2021)     ibuprofen (ADVIL,MOTRIN) 600 MG tablet Take 1 tablet (600 mg total) by mouth every 6 (six) hours. (Patient not taking: Reported on 09/19/2020) 30 tablet 0   No current facility-administered medications on file prior to visit.    No Known Allergies  Social History:  reports that she has quit smoking. She has never used smokeless tobacco. She reports that she does not drink alcohol and does not use drugs.  Family History  Problem Relation Age of Onset   Varicose Veins Mother    COPD Mother    Heart disease Father    Cancer Maternal Grandmother        lung cancer   Breast cancer Neg Hx     The following portions of the  patient's history were reviewed and updated as appropriate: allergies, current medications, past family history, past medical history, past social history, past surgical history and problem list.  Review of Systems Review of Systems  Constitutional:  Negative for chills and fever.  Gastrointestinal:  Negative for abdominal pain.  Genitourinary:  Negative for dyspareunia, dysuria, vaginal bleeding and vaginal discharge.      Physical Exam:  BP 120/79   Pulse 76   Ht 5\' 6"  (1.676 m)   Wt 228 lb 8 oz (103.6 kg)   BMI 36.88 kg/m  CONSTITUTIONAL: Well-developed, well-nourished female in no acute distress.  HENT:  Normocephalic, atraumatic, Oropharynx is clear and moist EYES: Conjunctivae normal. No scleral icterus.  SKIN: Skin is warm and dry. No rash noted. Not diaphoretic. No erythema. No pallor. MUSCULOSKELETAL: Normal range of motion.  No tenderness.  No cyanosis, clubbing, or edema.   NEUROLOGIC: Alert and oriented to person, place, and time. Normal muscle tone coordination.  PSYCHIATRIC: Normal mood and affect. Normal behavior. Normal judgment and thought content. CARDIOVASCULAR: Normal heart rate noted. RESPIRATORY: Nml rate. No problems with respiration noted. BREASTS: Declined exam. ABDOMEN: Declined PELVIC: Declined.  Pap smear not indicated or obtained.    Assessment and Plan:    1. Encounter for surveillance of injectable contraceptive  - medroxyPROGESTERone (DEPO-PROVERA) injection 150 mg  2. Screening mammogram for breast cancer  - MS Digital Screening; Future  3. Low Libido. Denies physical problems or relationship problems.  - Discussed counseling  4. Well-woman exam - Encouraged pt to establish care w/ PCP and discuss Hx GDM and Pre-E due to increased risk of Type 2 Dm and CHTN. List of providers given.  - Healthy lifestyle discussed  Pap smear w/ co-testing due 2026 Mammogram scheduled Routine preventative health maintenance measures emphasized. Please refer to After Visit Summary for other counseling recommendations.      2027, CNM [facility], [facility]

## 2021-10-23 NOTE — Patient Instructions (Signed)
AREA FAMILY PRACTICE PHYSICIANS  [place] ([address]) [facility] [address] [place], [place] [address] [phone] Mon-Fri 8:30-12:30, 1:30-5:00 Accepting Medicaid [facility] [address], [place], [place] [address] [phone] Mon-Fri 8:00-5:30 [facility] [address] [place], [place] [address] [phone] Mon, Tue, Thur, Fri 8:30-5:00, Wed 10:00-7:00 (closed 1-2pm) Accepting Medicaid [facility] [address], [place], [place]  [address] Phone - [phone]   Fax - [phone]  [place] ([address]) [facility] [address] [place], [place] [address] [phone] Mon-Fri 8:00-5:00 [facility] ([facility])  [address] [place], [place] [address] [phone] Mon-Fri 8:30-5:30, Sat (Oct.-Mar.) 9:00-1:00 Accepting Medicaid  [place] ([address]) [facility] [address], [place], [place] [address] [phone] Mon-Fri 8:00-5:00  [place] ([address]) [facility] [address], [place], [place] [address] [phone] Mon-Fri 8:00-5:00 [facility] [address], [place], [place] [address] [phone] Mon-Fri 8:00-5:00 [facility] [address] [place], [place] [address] [phone] Mon-Fri 8:00-5:00 [facility] [address] [place] [address] [phone] Mon-Fri 7:30-5:30  [place] ([address] & [address]) [facility] [address] [place], [place] [address] [phone] Mon-Thur 8:00-6:00 Accepting Medicaid [facility] [address] [place], [place] [address] [phone] Mon-Thur 7:30-7:30, Fri 7:30-4:30 Accepting  Medicaid [facility] [address], [place], [place]  [address] [phone]   Fax - [phone]  [place]  ([address] & [address]) [facility] [address] , [place] [address] [phone] Mon-Fri 7:00-5:00 [facility] [address], [place], [place] [address] [phone] Mon-Fri 8:00-5:00 Accepting Medicaid [facility] [address], , [place] [address] [phone] Mon-Fri 8:00-5:00 Accepting Medicaid  [place] ([address]) [facility] [place] [address] [place], [place] [address] [phone] Mon-Fri 8:00-5:00 [facility] [address], [place], [place] [address] [phone] Mon-Fri 8:00-5:00 Accepting Medicaid [facility] ([facility] [address], [place], [place] [address] [phone] Mon-Fri 8:00-5:30, Sat&Sun by appointment (phones open at 8:30) Accepting Medicaid  [place] ([address]) [facility] [address] [place], [place] [address] [phone] Mon-Thur 8:00-7:00, Fri 8:00-5:00, Sat 8:00-12:00, Sun 9:00-12:00 Accepting Medicaid [facility] [address], [place], [place] [address] [phone] Mon-Thur 8:00-5:00 Accepting Medicaid [facility] [address] [place], [place] [address] [phone] Mon-Fri 8:00-5:30, Sat (Oct.-Mar.) 9:00-1:00 Accepting Medicaid  [place] ([address]) [facility] [address], [place], [place] [address] [phone] Mon-Fri 8:00-5:00 Accepting Medicaid   [place] ([address]) [facility] [place] [address], [place], [place] [address] [phone] Mon-Fri 8:00-5:00 [facility] [place] [address], [place], [place] [address] [phone] Mon-Fri 8:00-5:00 [facility] [place] [address] [place] [address], [place], [place] [address] [phone] Mon-Fri 8:00-5:00 After hours clinic ([address] [place],  [address]) [phone] Mon-Fri 5:00-8:00, Sat 12:00-6:00, Sun 10:00-4:00 Accepting Medicaid [facility] [place]   [address], [place], [place]  [address] [phone]   Fax - [phone]  [place] ([address]) [facility] [place] [facility] [address], [place], [place] [address] [phone] Mon-Fri 8:00-5:00 [facility] [place] ([facility] [place]) [address], [place], [place] [address] [phone] Mon-Thur 8:00-7:00, Fri 8:00-5:00, Sat 8:00-12:00    

## 2022-01-15 ENCOUNTER — Ambulatory Visit (INDEPENDENT_AMBULATORY_CARE_PROVIDER_SITE_OTHER): Payer: BC Managed Care – PPO

## 2022-01-15 VITALS — BP 128/91 | HR 68

## 2022-01-15 DIAGNOSIS — Z3042 Encounter for surveillance of injectable contraceptive: Secondary | ICD-10-CM | POA: Diagnosis not present

## 2022-01-15 MED ORDER — MEDROXYPROGESTERONE ACETATE 150 MG/ML IM SUSP
150.0000 mg | Freq: Once | INTRAMUSCULAR | Status: AC
  Administered 2022-01-15: 150 mg via INTRAMUSCULAR

## 2022-01-15 NOTE — Addendum Note (Signed)
Addended by: Nira Retort D on: 01/15/2022 10:38 AM   Modules accepted: Orders

## 2022-01-15 NOTE — Progress Notes (Signed)
Stephanie Wells here for Depo-Provera Injection. Injection administered without complication. Patient will return in 3 months for next injection between 01/30 and 02/13. Next annual visit due September 2024.   Pt also had complaints of smell under breasts with some irritation. Pt advised to try Lotrimin cream and ensure area under breast is completely dry after showering. Pt verbalized understanding and denied further questions.   Janeece Agee, RN 01/15/2022  10:21 AM

## 2022-04-02 ENCOUNTER — Ambulatory Visit: Payer: BC Managed Care – PPO

## 2022-04-11 ENCOUNTER — Ambulatory Visit (INDEPENDENT_AMBULATORY_CARE_PROVIDER_SITE_OTHER): Payer: BC Managed Care – PPO

## 2022-04-11 VITALS — BP 125/82 | HR 71 | Wt 232.7 lb

## 2022-04-11 DIAGNOSIS — Z3042 Encounter for surveillance of injectable contraceptive: Secondary | ICD-10-CM

## 2022-04-11 DIAGNOSIS — Z1231 Encounter for screening mammogram for malignant neoplasm of breast: Secondary | ICD-10-CM

## 2022-04-11 MED ORDER — MEDROXYPROGESTERONE ACETATE 150 MG/ML IM SUSP
150.0000 mg | Freq: Once | INTRAMUSCULAR | Status: AC
  Administered 2022-04-11: 150 mg via INTRAMUSCULAR

## 2022-04-11 NOTE — Progress Notes (Signed)
Eduardo Osier here for Depo-Provera Injection. Injection administered without complication. Patient will return in 3 months for next injection between 06/28/22 and 07/12/22. Next annual visit due September 2024.   Mammogram scheduled for 04/23/22 at pt request.   Annabell Howells, RN 04/11/2022  4:22 PM

## 2022-04-23 ENCOUNTER — Ambulatory Visit
Admission: RE | Admit: 2022-04-23 | Discharge: 2022-04-23 | Disposition: A | Discharge status: Home / Self Care | Payer: BC Managed Care – PPO | Source: Ambulatory Visit | Attending: Advanced Practice Midwife | Admitting: Advanced Practice Midwife

## 2022-04-23 DIAGNOSIS — Z1231 Encounter for screening mammogram for malignant neoplasm of breast: Secondary | ICD-10-CM

## 2022-06-28 ENCOUNTER — Ambulatory Visit: Payer: BC Managed Care – PPO

## 2022-07-02 ENCOUNTER — Ambulatory Visit: Payer: BC Managed Care – PPO

## 2022-07-02 ENCOUNTER — Telehealth: Payer: Self-pay

## 2022-07-02 NOTE — Telephone Encounter (Signed)
Called pt with intent to offer afternoon appointment today instead of AM due to schedule availability. Pt states she was unaware of appt this AM and thought this was tomorrow AM. Requests to move appt if possible. Rescheduled for tomorrow at 10 AM.

## 2022-07-03 ENCOUNTER — Other Ambulatory Visit: Payer: Self-pay

## 2022-07-03 ENCOUNTER — Ambulatory Visit (INDEPENDENT_AMBULATORY_CARE_PROVIDER_SITE_OTHER): Payer: BC Managed Care – PPO

## 2022-07-03 VITALS — BP 121/80 | HR 69 | Ht 66.0 in | Wt 218.3 lb

## 2022-07-03 DIAGNOSIS — Z3042 Encounter for surveillance of injectable contraceptive: Secondary | ICD-10-CM | POA: Diagnosis not present

## 2022-07-03 MED ORDER — MEDROXYPROGESTERONE ACETATE 150 MG/ML IM SUSY
150.0000 mg | PREFILLED_SYRINGE | Freq: Once | INTRAMUSCULAR | Status: AC
  Administered 2022-07-03: 150 mg via INTRAMUSCULAR

## 2022-07-03 NOTE — Progress Notes (Signed)
Stephanie Wells here for Depo-Provera Injection. Injection administered without complication. Patient will return in 3 months for next injection between July 17 and July 31. Next annual visit will be scheduled with front office upon checking out.   Ralene Bathe, RN 07/03/2022  10:14 AM

## 2022-08-20 ENCOUNTER — Ambulatory Visit (INDEPENDENT_AMBULATORY_CARE_PROVIDER_SITE_OTHER): Payer: BC Managed Care – PPO | Admitting: Obstetrics and Gynecology

## 2022-08-20 ENCOUNTER — Other Ambulatory Visit: Payer: Self-pay

## 2022-08-20 ENCOUNTER — Encounter: Payer: Self-pay | Admitting: Family Medicine

## 2022-08-20 VITALS — BP 122/70 | HR 81 | Ht 66.0 in | Wt 221.3 lb

## 2022-08-20 DIAGNOSIS — Z3009 Encounter for other general counseling and advice on contraception: Secondary | ICD-10-CM

## 2022-08-20 NOTE — Progress Notes (Signed)
GYNECOLOGY VISIT  Patient name: Stephanie Wells MRN [id number]  Date of birth: 22-Sep-1980 Chief Complaint:   Gynecologic Exam  History:  Stephanie Wells is a 42 y.o. [id number] being seen today for TL discussion.  She has been using depo provera and amenorrheic but does not want to continue using medical contraception for potentially the next 10 years. Mother was not menopausal until mid/late 55s. She is sure she has completed childbearing  No FH of issues with anesthesia  No issues with pain medication   Past Medical History:  Diagnosis Date   Asthma    Diabetes mellitus without complication (HCC)    Gestational diabetes    History of gestational diabetes 10/23/2021   History of pre-eclampsia 10/23/2021   Hypertension    history of pre-eclampsia   Vaginal Pap smear, abnormal    had colpo f/u wnl    Past Surgical History:  Procedure Laterality Date   BREAST CYST EXCISION Left    VAGINAL DELIVERY     WISDOM TOOTH EXTRACTION      The following portions of the patient's history were reviewed and updated as appropriate: allergies, current medications, past family history, past medical history, past social history, past surgical history and problem list.   Health Maintenance:   Last pap     Component Value Date/Time   DIAGPAP  09/08/2019 1132    - Negative for intraepithelial lesion or malignancy (NILM)   DIAGPAP  08/26/2017 0000    NEGATIVE FOR INTRAEPITHELIAL LESIONS OR MALIGNANCY.   DIAGPAP  07/24/2016 0000    NEGATIVE FOR INTRAEPITHELIAL LESIONS OR MALIGNANCY.   HPVHIGH Negative 09/08/2019 1132   ADEQPAP  09/08/2019 1132    Satisfactory for evaluation; transformation zone component PRESENT.   ADEQPAP  08/26/2017 0000    Satisfactory for evaluation  endocervical/transformation zone component PRESENT.   ADEQPAP  07/24/2016 0000    Satisfactory for evaluation  endocervical/transformation zone component PRESENT.    High Risk HPV: Positive  Adequacy:  Satisfactory for  evaluation, transformation zone component PRESENT  Diagnosis:  Atypical squamous cells of undetermined significance (ASC-US)  Last mammogram: 2024 BIRADS 1   Review of Systems:  Pertinent items are noted in HPI. Comprehensive review of systems was otherwise negative.   Objective:  Physical Exam BP 122/70   Pulse 81   Ht 5\' 6"  (1.676 m)   Wt 221 lb 4.8 oz (100.4 kg)   LMP  (LMP Unknown)   BMI 35.72 kg/m    Physical Exam Vitals and nursing note reviewed.  Constitutional:      Appearance: Normal appearance.  HENT:     Head: Normocephalic and atraumatic.  Pulmonary:     Effort: Pulmonary effort is normal.  Skin:    General: Skin is warm and dry.  Neurological:     General: No focal deficit present.     Mental Status: She is alert.  Psychiatric:        Mood and Affect: Mood normal.        Behavior: Behavior normal.        Thought Content: Thought content normal.        Judgment: Judgment normal.         Assessment & Plan:   1. Sterilization consult She desires permanent sterilization. Discussed alternatives including LARC options and vasectomy. She declines these options. Discussed surgery of salpingectomy vs tubal ligation. She would like to do a salpingectomy.  Risks of surgery include but are not limited to: bleeding, infection, injury to  surrounding organs/tissues (i.e. bowel/bladder/ureters), need for additional procedures, wound complications, hospital re-admission, regret,  conversion to open surgery, and VTE.  Reviewed restrictions and recovery following surgery     Lorriane Shire, MD [facility] for [facility], [facility]

## 2022-09-24 ENCOUNTER — Telehealth: Payer: Self-pay

## 2022-09-24 ENCOUNTER — Other Ambulatory Visit: Payer: Self-pay

## 2022-09-24 ENCOUNTER — Ambulatory Visit: Payer: BC Managed Care – PPO

## 2022-09-24 VITALS — BP 123/76 | HR 80 | Ht 66.0 in | Wt 223.6 lb

## 2022-09-24 DIAGNOSIS — Z3042 Encounter for surveillance of injectable contraceptive: Secondary | ICD-10-CM

## 2022-09-24 MED ORDER — MEDROXYPROGESTERONE ACETATE 150 MG/ML IM SUSY
150.0000 mg | PREFILLED_SYRINGE | Freq: Once | INTRAMUSCULAR | Status: AC
  Administered 2022-09-24: 150 mg via INTRAMUSCULAR

## 2022-09-24 NOTE — Progress Notes (Signed)
Stephanie Wells here for Depo-Provera Injection. Injection administered without complication. Patient will return in 3 months for next injection between Oct 8 and Oct 22. Next annual visit due 08/2023.  Pt scheduled for 12/10/22 at 0920 for next Depo Provera.    Ralene Bathe, RN 09/24/2022  9:15 AM

## 2022-09-27 NOTE — Telephone Encounter (Signed)
Contacted patient to schedule surgery w/ Dr. Briscoe Deutscher. Patient answered and then hung up. Called back multiple times and only received the voicemail. Left message requesting a call back.

## 2022-10-07 ENCOUNTER — Ambulatory Visit (HOSPITAL_BASED_OUTPATIENT_CLINIC_OR_DEPARTMENT_OTHER): Admit: 2022-10-07 | Payer: BC Managed Care – PPO | Admitting: Obstetrics and Gynecology

## 2022-10-07 ENCOUNTER — Encounter (HOSPITAL_BASED_OUTPATIENT_CLINIC_OR_DEPARTMENT_OTHER): Payer: Self-pay

## 2022-10-07 SURGERY — SALPINGECTOMY, BILATERAL, LAPAROSCOPIC
Anesthesia: Choice | Laterality: Bilateral

## 2022-10-14 ENCOUNTER — Telehealth: Payer: Self-pay

## 2022-10-14 NOTE — Telephone Encounter (Signed)
Attempted to reach patient to schedule her surgery w/ Dr. Briscoe Deutscher. Call was forwarded to voicemail immediately. I Left a message asking patient to call me to schedule procedure.

## 2022-10-16 ENCOUNTER — Telehealth: Payer: Self-pay

## 2022-10-16 NOTE — Telephone Encounter (Signed)
Reached out to patient to schedule her procedure with Dr. Briscoe Deutscher. Phone immediately went to voicemail. I left a message stating it was my third time reaching out to schedule her procedure and to please give me a call back.

## 2022-11-12 ENCOUNTER — Ambulatory Visit: Payer: BC Managed Care – PPO

## 2022-12-10 ENCOUNTER — Ambulatory Visit (INDEPENDENT_AMBULATORY_CARE_PROVIDER_SITE_OTHER): Payer: BC Managed Care – PPO

## 2022-12-10 ENCOUNTER — Other Ambulatory Visit: Payer: Self-pay

## 2022-12-10 VITALS — BP 127/92 | HR 80 | Ht 66.0 in | Wt 226.2 lb

## 2022-12-10 DIAGNOSIS — Z3042 Encounter for surveillance of injectable contraceptive: Secondary | ICD-10-CM | POA: Diagnosis not present

## 2022-12-10 MED ORDER — MEDROXYPROGESTERONE ACETATE 150 MG/ML IM SUSY
150.0000 mg | PREFILLED_SYRINGE | Freq: Once | INTRAMUSCULAR | Status: AC
  Administered 2022-12-10: 150 mg via INTRAMUSCULAR

## 2022-12-10 NOTE — Progress Notes (Signed)
Stephanie Wells here for Depo-Provera Injection. Patient received last injection on 09/24/22; patient is 11w from last dose--within window to receive next dose today. Injection administered without complication. Patient will return in 3 months for next injection between 02/25/23 and 03/11/23. Next annual visit due 08/20/23.   Patient scheduled for next depo shot on 03/11/23 at 0900.   Meryl Crutch, RN 12/10/2022  8:24 AM

## 2023-03-04 ENCOUNTER — Ambulatory Visit: Payer: BC Managed Care – PPO

## 2023-03-05 DIAGNOSIS — N63 Unspecified lump in unspecified breast: Secondary | ICD-10-CM | POA: Insufficient documentation

## 2023-03-05 HISTORY — DX: Unspecified lump in unspecified breast: N63.0

## 2023-03-11 ENCOUNTER — Ambulatory Visit (INDEPENDENT_AMBULATORY_CARE_PROVIDER_SITE_OTHER): Payer: BC Managed Care – PPO

## 2023-03-11 ENCOUNTER — Other Ambulatory Visit: Payer: Self-pay

## 2023-03-11 VITALS — BP 123/83 | HR 81 | Ht 66.0 in | Wt 236.0 lb

## 2023-03-11 DIAGNOSIS — Z3042 Encounter for surveillance of injectable contraceptive: Secondary | ICD-10-CM

## 2023-03-11 MED ORDER — MEDROXYPROGESTERONE ACETATE 150 MG/ML IM SUSY
150.0000 mg | PREFILLED_SYRINGE | Freq: Once | INTRAMUSCULAR | Status: AC
  Administered 2023-03-11: 150 mg via INTRAMUSCULAR

## 2023-03-11 NOTE — Progress Notes (Signed)
 Stephanie Wells here for Depo-Provera Injection. Injection administered without complication. Patient will return in 3 months for next injection between 3/25 and 4/8. Next annual visit due 08/2023.   Quintella Reichert, RN 03/11/2023  9:08 AM

## 2023-04-21 ENCOUNTER — Other Ambulatory Visit: Payer: Self-pay | Admitting: Advanced Practice Midwife

## 2023-04-21 DIAGNOSIS — Z0000 Encounter for general adult medical examination without abnormal findings: Secondary | ICD-10-CM

## 2023-05-01 ENCOUNTER — Ambulatory Visit
Admission: RE | Admit: 2023-05-01 | Discharge: 2023-05-01 | Disposition: A | Discharge status: Home / Self Care | Payer: BC Managed Care – PPO | Source: Ambulatory Visit | Attending: Advanced Practice Midwife | Admitting: Advanced Practice Midwife

## 2023-05-01 DIAGNOSIS — Z0000 Encounter for general adult medical examination without abnormal findings: Secondary | ICD-10-CM

## 2023-05-07 ENCOUNTER — Other Ambulatory Visit: Payer: Self-pay | Admitting: Advanced Practice Midwife

## 2023-05-07 DIAGNOSIS — R928 Other abnormal and inconclusive findings on diagnostic imaging of breast: Secondary | ICD-10-CM

## 2023-05-20 ENCOUNTER — Ambulatory Visit
Admission: RE | Admit: 2023-05-20 | Discharge: 2023-05-20 | Disposition: A | Discharge status: Home / Self Care | Source: Ambulatory Visit | Attending: Advanced Practice Midwife | Admitting: Advanced Practice Midwife

## 2023-05-20 ENCOUNTER — Other Ambulatory Visit: Payer: Self-pay | Admitting: Advanced Practice Midwife

## 2023-05-20 DIAGNOSIS — R928 Other abnormal and inconclusive findings on diagnostic imaging of breast: Secondary | ICD-10-CM

## 2023-05-20 DIAGNOSIS — N6320 Unspecified lump in the left breast, unspecified quadrant: Secondary | ICD-10-CM

## 2023-05-26 ENCOUNTER — Encounter: Payer: Self-pay | Admitting: Advanced Practice Midwife

## 2023-06-03 ENCOUNTER — Ambulatory Visit: Payer: BC Managed Care – PPO

## 2023-09-19 ENCOUNTER — Encounter: Payer: Self-pay | Admitting: Advanced Practice Midwife

## 2023-11-25 ENCOUNTER — Ambulatory Visit
Admission: RE | Admit: 2023-11-25 | Discharge: 2023-11-25 | Disposition: A | Discharge status: Home / Self Care | Source: Ambulatory Visit | Attending: Advanced Practice Midwife | Admitting: Advanced Practice Midwife

## 2023-11-25 ENCOUNTER — Other Ambulatory Visit: Payer: Self-pay | Admitting: Advanced Practice Midwife

## 2023-11-25 DIAGNOSIS — N6320 Unspecified lump in the left breast, unspecified quadrant: Secondary | ICD-10-CM

## 2023-11-25 DIAGNOSIS — N6323 Unspecified lump in the left breast, lower outer quadrant: Secondary | ICD-10-CM

## 2023-12-02 ENCOUNTER — Ambulatory Visit
Admission: RE | Admit: 2023-12-02 | Discharge: 2023-12-02 | Disposition: A | Source: Ambulatory Visit | Attending: Advanced Practice Midwife | Admitting: Advanced Practice Midwife

## 2023-12-02 DIAGNOSIS — N6323 Unspecified lump in the left breast, lower outer quadrant: Secondary | ICD-10-CM

## 2023-12-02 HISTORY — PX: BREAST BIOPSY: SHX20

## 2023-12-03 LAB — SURGICAL PATHOLOGY
